# Patient Record
Sex: Female | Born: 1980 | Race: White | Hispanic: No | Marital: Single | Smoking: Former smoker
Health system: Southern US, Community
[De-identification: ages and names within clinical notes are randomized; demographics above are authoritative.]

## PROBLEM LIST (undated history)

## (undated) DIAGNOSIS — G8929 Other chronic pain: Secondary | ICD-10-CM

## (undated) DIAGNOSIS — M62830 Muscle spasm of back: Secondary | ICD-10-CM

## (undated) DIAGNOSIS — M545 Low back pain, unspecified: Secondary | ICD-10-CM

## (undated) DIAGNOSIS — R0602 Shortness of breath: Secondary | ICD-10-CM

## (undated) DIAGNOSIS — K219 Gastro-esophageal reflux disease without esophagitis: Secondary | ICD-10-CM

## (undated) DIAGNOSIS — M549 Dorsalgia, unspecified: Secondary | ICD-10-CM

## (undated) DIAGNOSIS — F329 Major depressive disorder, single episode, unspecified: Secondary | ICD-10-CM

## (undated) DIAGNOSIS — F32A Depression, unspecified: Secondary | ICD-10-CM

## (undated) DIAGNOSIS — O99210 Obesity complicating pregnancy, unspecified trimester: Secondary | ICD-10-CM

## (undated) DIAGNOSIS — F419 Anxiety disorder, unspecified: Secondary | ICD-10-CM

## (undated) DIAGNOSIS — O1413 Severe pre-eclampsia, third trimester: Secondary | ICD-10-CM

## (undated) DIAGNOSIS — O30033 Twin pregnancy, monochorionic/diamniotic, third trimester: Secondary | ICD-10-CM

## (undated) DIAGNOSIS — E669 Obesity, unspecified: Secondary | ICD-10-CM

## (undated) HISTORY — DX: Gastro-esophageal reflux disease without esophagitis: K21.9

## (undated) HISTORY — DX: Twin pregnancy, monochorionic/diamniotic, third trimester: O30.033

## (undated) HISTORY — PX: NO PAST SURGERIES: SHX2092

## (undated) HISTORY — DX: Major depressive disorder, single episode, unspecified: F32.9

## (undated) HISTORY — DX: Low back pain, unspecified: M54.50

## (undated) HISTORY — DX: Anxiety disorder, unspecified: F41.9

## (undated) HISTORY — DX: Depression, unspecified: F32.A

## (undated) HISTORY — DX: Dorsalgia, unspecified: M54.9

## (undated) HISTORY — DX: Muscle spasm of back: M62.830

## (undated) HISTORY — DX: Morbid (severe) obesity due to excess calories: E66.01

## (undated) HISTORY — DX: Obesity, unspecified: E66.9

## (undated) HISTORY — DX: Morbid (severe) obesity due to excess calories: O99.210

## (undated) HISTORY — DX: Severe pre-eclampsia, third trimester: O14.13

## (undated) HISTORY — DX: Other chronic pain: G89.29

## (undated) HISTORY — DX: Shortness of breath: R06.02

---

## 2010-11-09 ENCOUNTER — Emergency Department: Payer: Self-pay | Admitting: Emergency Medicine

## 2010-12-02 ENCOUNTER — Ambulatory Visit: Payer: Self-pay | Admitting: Nurse Practitioner

## 2011-01-02 ENCOUNTER — Other Ambulatory Visit: Payer: Self-pay | Admitting: Gastroenterology

## 2012-01-17 IMAGING — CT CT ABD-PELV W/ CM
1 of 2 series · 14 of 32 positions shown, 18 images · non-contrast
Comparison: none

REASON FOR EXAM: (1) RUQ pain off and on 2 weeks; (2) LLQ pain with
diarrhea for over one week
COMMENTS:   LMP: Negative Beta HCG

[Series 2: 3mm soft tissue · axial · 0.89mm/px · z∈[-552,-98]mm · 14 of 172 slices shown, 18 images]
[im 14/172  soft-tissue]
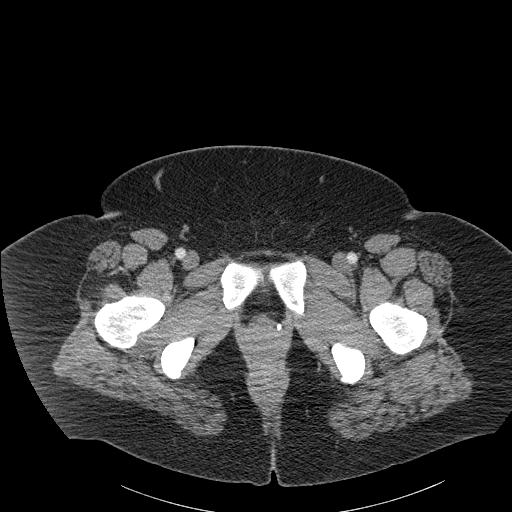
[im 14/172  bone]
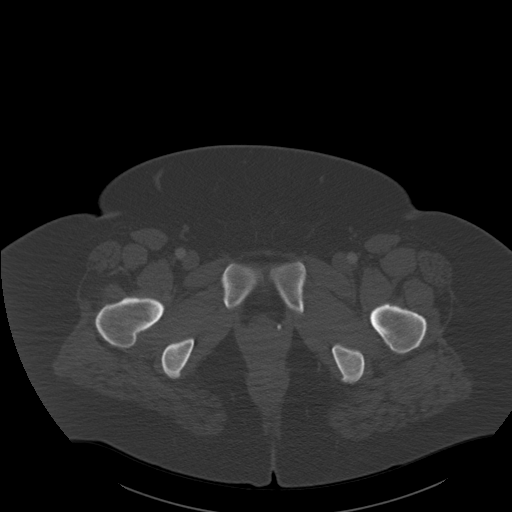
[im 28/172  soft-tissue]
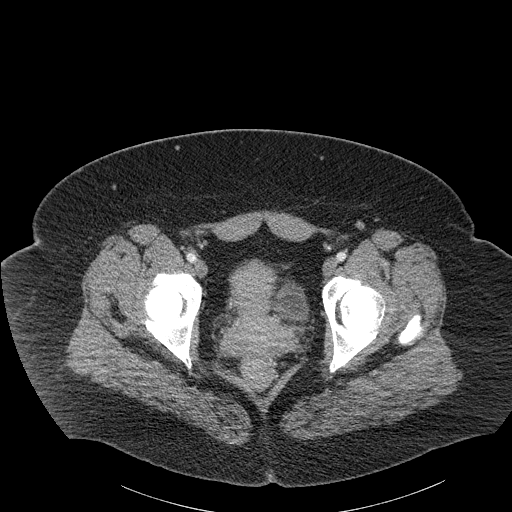
[im 42/172  soft-tissue]
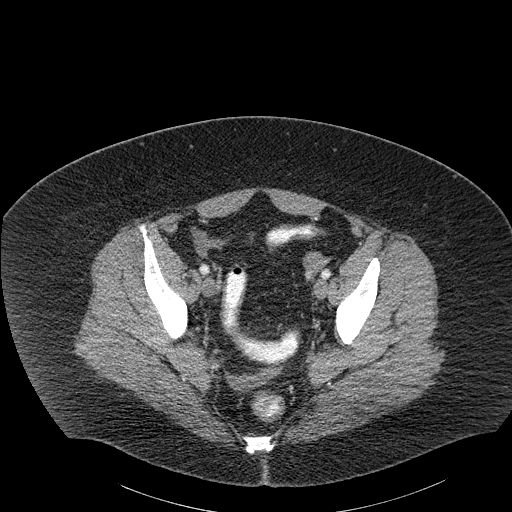
[im 55/172  soft-tissue]
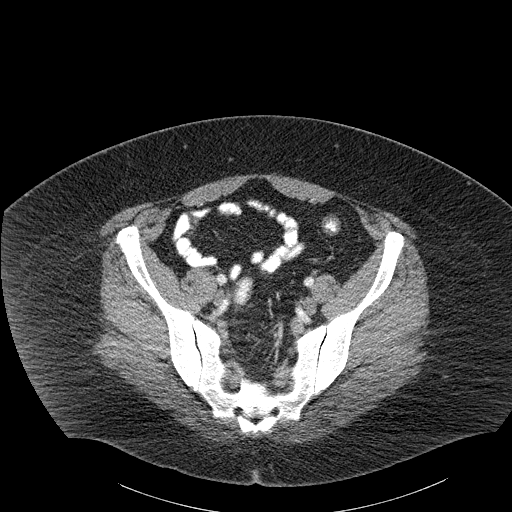
[im 69/172  soft-tissue]
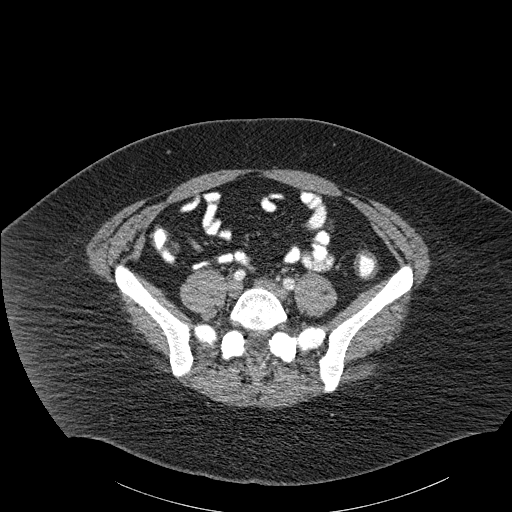
[im 83/172  soft-tissue]
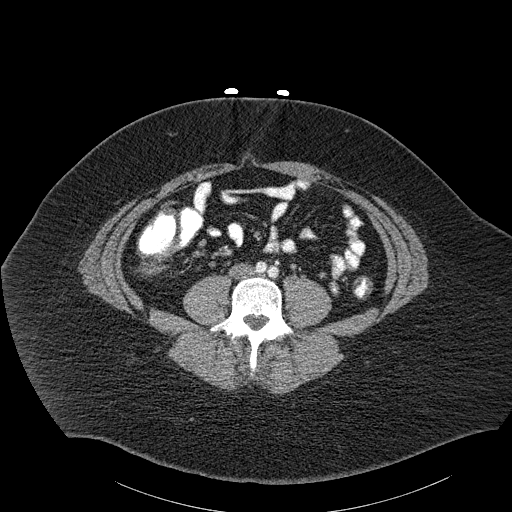
[im 96/172  soft-tissue]
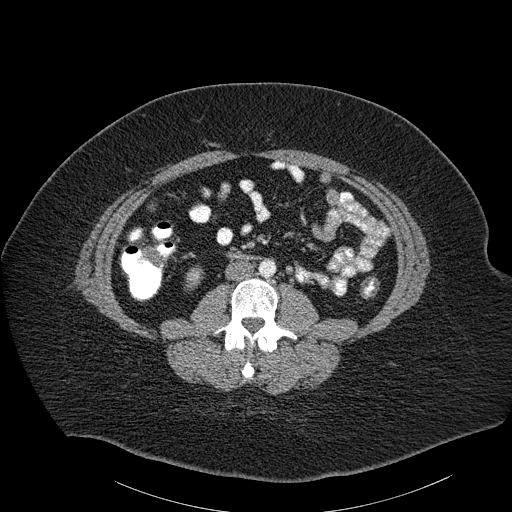
[im 110/172  soft-tissue]
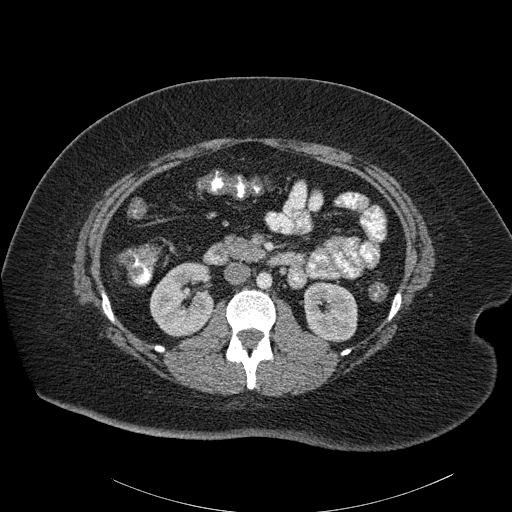
[im 124/172  soft-tissue]
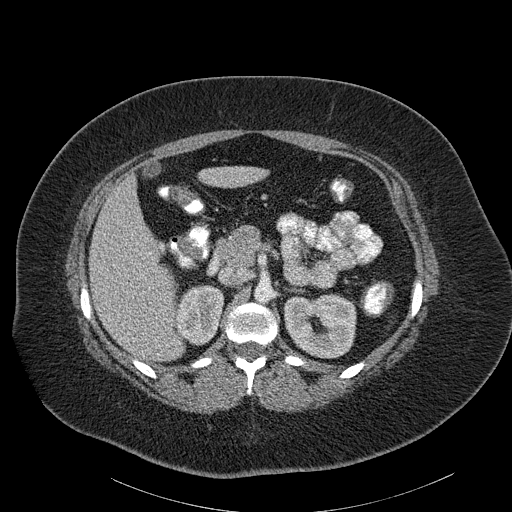
[im 124/172  bone]
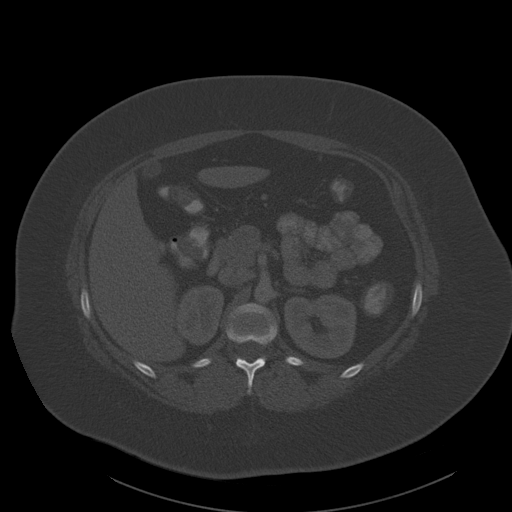
[im 137/172  soft-tissue]
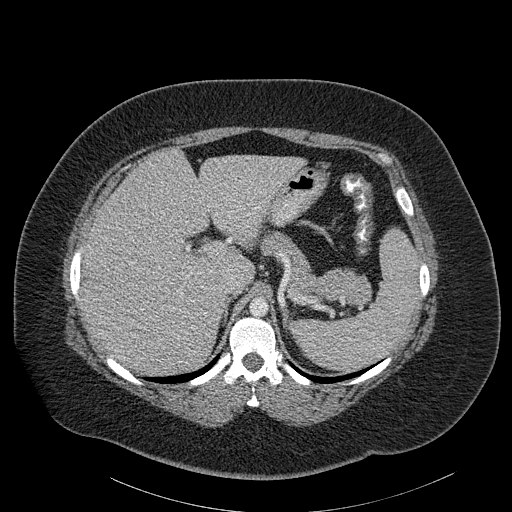
[im 144/172  lung]
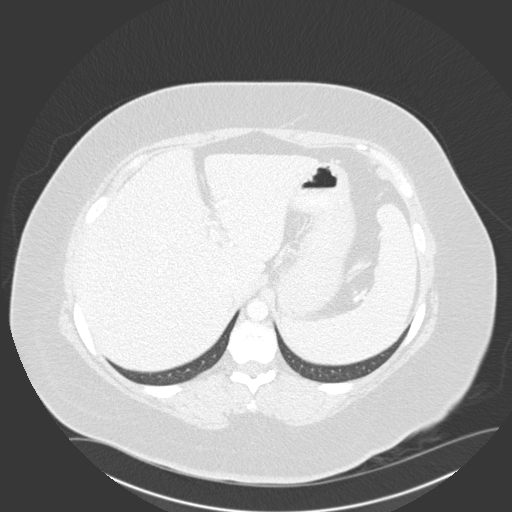
[im 151/172  soft-tissue]
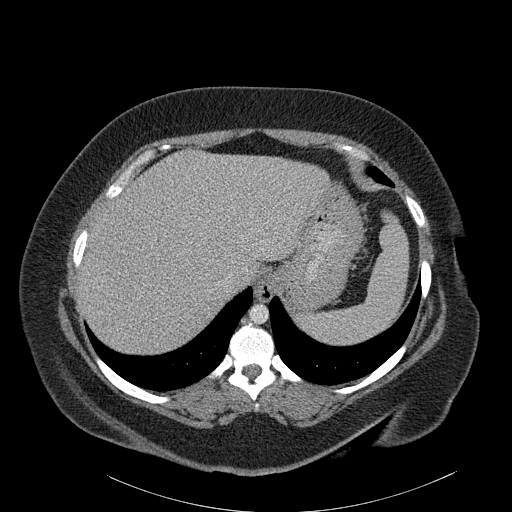
[im 151/172  lung]
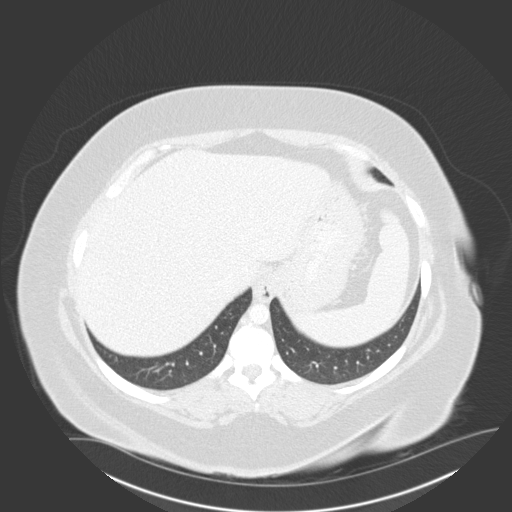
[im 158/172  lung]
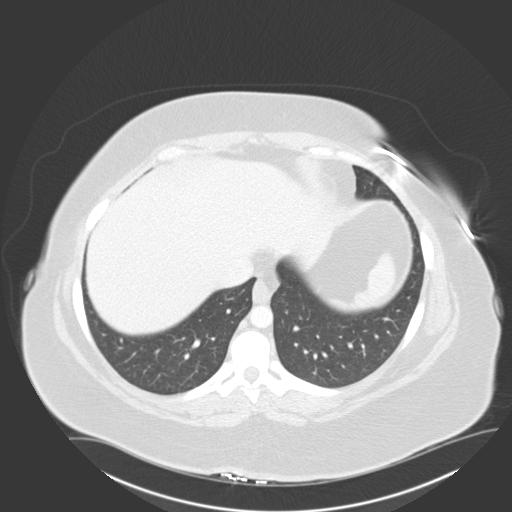
[im 165/172  soft-tissue]
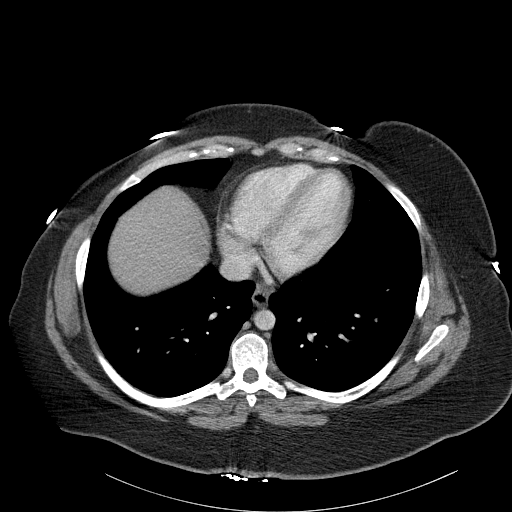
[im 165/172  lung]
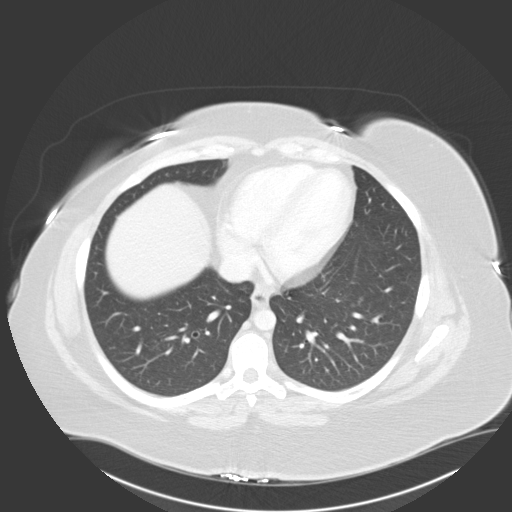

[14 of 32 positions shown; findings below may reference images not displayed]

PROCEDURE:     CT  - CT ABDOMEN / PELVIS  W  - November 09, 2010  [DATE]

RESULT:     Axial CT scanning was performed through the abdomen at 3 mm
intervals and slice thicknesses following intravenous administration of 100
cc of [id number]-[id number]. The patient also received oral contrast material. Review
of multiplanar reconstructed images was performed separately on the [employer]
Server monitor.

The orally administered contrast has traversed the small bowel and reached
the colon. The small bowel exhibits no evidence of ileus or obstruction. The
colon exhibits no evidence of obstruction. Its distention is fairly minimal
and there is subjective wall thickening of the transverse colon and
descending colon. The rectosigmoid colon is relatively featureless. There is
a small amount of free fluid in the prerectal space. There is no evidence of
an inguinal nor of significant umbilical hernia. There is a structure
demonstrated consistent with a normal calibered appendix. There are a few
mesenteric lymph nodes present in the right lower quadrant of the abdomen
which are normal in size. I see no inflammatory changes in the fat
surrounding the colon.

The liver exhibits mildly decreased density consistent with fatty
infiltration. The gallbladder is adequately distended with no evidence of
calcified stones. The pancreas, spleen, partially distended stomach, adrenal
glands, and kidneys are normal in appearance. The periaortic and pericaval
regions also appear normal. Within the pelvis the uterus and adnexal
structures and urinary bladder are within the limits of normal. The lumbar
vertebral bodies are preserved in height. The lung bases are clear.
IMPRESSION: 1. I do not see evidence of bowel obstruction or ileus. There is mild
prominence of the wall of the transverse and descending portions of the
colon. The rectosigmoid colon demonstrates no more than minimal wall
thickening but it is relatively featureless in appearance. I see no
pericolonic inflammatory change. A normal appearing appendix is demonstrated.
2. The uterus and adnexal structures exhibit no acute abnormality. There is
a small amount of free fluid in the pelvis.
3. There are a few normal sized mesenteric lymph nodes in the right lower
quadrant of the abdomen.
4. I see no acute hepatobiliary abnormality nor acute urinary tract
abnormality.

## 2013-12-22 ENCOUNTER — Ambulatory Visit: Payer: Self-pay | Admitting: Family Medicine

## 2013-12-22 LAB — RAPID STREP-A WITH REFLX: Micro Text Report: NEGATIVE

## 2013-12-24 LAB — BETA STREP CULTURE(ARMC)

## 2014-08-28 ENCOUNTER — Encounter: Payer: Self-pay | Admitting: Physician Assistant

## 2014-09-04 ENCOUNTER — Encounter: Payer: Self-pay | Admitting: Physician Assistant

## 2014-10-05 ENCOUNTER — Encounter: Payer: Self-pay | Admitting: Physician Assistant

## 2014-11-05 ENCOUNTER — Encounter: Payer: Self-pay | Admitting: Physician Assistant

## 2014-12-22 ENCOUNTER — Ambulatory Visit: Payer: Self-pay | Admitting: Internal Medicine

## 2015-01-15 ENCOUNTER — Encounter: Payer: Self-pay | Admitting: *Deleted

## 2015-03-12 DIAGNOSIS — M47817 Spondylosis without myelopathy or radiculopathy, lumbosacral region: Secondary | ICD-10-CM | POA: Insufficient documentation

## 2015-03-12 DIAGNOSIS — M47816 Spondylosis without myelopathy or radiculopathy, lumbar region: Secondary | ICD-10-CM | POA: Insufficient documentation

## 2015-03-12 DIAGNOSIS — M62830 Muscle spasm of back: Secondary | ICD-10-CM | POA: Insufficient documentation

## 2015-11-29 DIAGNOSIS — Z720 Tobacco use: Secondary | ICD-10-CM | POA: Diagnosis not present

## 2015-11-29 DIAGNOSIS — Z0000 Encounter for general adult medical examination without abnormal findings: Secondary | ICD-10-CM | POA: Diagnosis not present

## 2015-11-29 DIAGNOSIS — E782 Mixed hyperlipidemia: Secondary | ICD-10-CM | POA: Diagnosis not present

## 2015-11-29 DIAGNOSIS — R918 Other nonspecific abnormal finding of lung field: Secondary | ICD-10-CM | POA: Diagnosis not present

## 2015-11-29 DIAGNOSIS — Z79899 Other long term (current) drug therapy: Secondary | ICD-10-CM | POA: Diagnosis not present

## 2015-12-03 DIAGNOSIS — E782 Mixed hyperlipidemia: Secondary | ICD-10-CM | POA: Diagnosis not present

## 2015-12-03 DIAGNOSIS — Z79899 Other long term (current) drug therapy: Secondary | ICD-10-CM | POA: Diagnosis not present

## 2017-05-07 DIAGNOSIS — G8929 Other chronic pain: Secondary | ICD-10-CM | POA: Insufficient documentation

## 2017-05-07 DIAGNOSIS — M545 Low back pain, unspecified: Secondary | ICD-10-CM | POA: Insufficient documentation

## 2017-06-18 ENCOUNTER — Encounter: Payer: Self-pay | Admitting: Maternal Newborn

## 2017-06-28 ENCOUNTER — Ambulatory Visit (INDEPENDENT_AMBULATORY_CARE_PROVIDER_SITE_OTHER): Payer: BC Managed Care – PPO | Admitting: Maternal Newborn

## 2017-06-28 ENCOUNTER — Encounter: Payer: Self-pay | Admitting: Maternal Newborn

## 2017-06-28 VITALS — BP 122/82 | HR 83 | Ht 69.0 in | Wt 293.0 lb

## 2017-06-28 DIAGNOSIS — O0990 Supervision of high risk pregnancy, unspecified, unspecified trimester: Secondary | ICD-10-CM | POA: Insufficient documentation

## 2017-06-28 DIAGNOSIS — Z23 Encounter for immunization: Secondary | ICD-10-CM | POA: Diagnosis not present

## 2017-06-28 DIAGNOSIS — Z3400 Encounter for supervision of normal first pregnancy, unspecified trimester: Secondary | ICD-10-CM

## 2017-06-28 DIAGNOSIS — Z3401 Encounter for supervision of normal first pregnancy, first trimester: Secondary | ICD-10-CM | POA: Diagnosis not present

## 2017-06-28 DIAGNOSIS — Z6841 Body Mass Index (BMI) 40.0 and over, adult: Secondary | ICD-10-CM | POA: Diagnosis not present

## 2017-06-28 DIAGNOSIS — N926 Irregular menstruation, unspecified: Secondary | ICD-10-CM

## 2017-06-28 LAB — POCT URINE PREGNANCY: Preg Test, Ur: POSITIVE — AB

## 2017-06-28 NOTE — Patient Instructions (Signed)
First Trimester of Pregnancy The first trimester of pregnancy is from week 1 until the end of week 13 (months 1 through 3). A week after a sperm fertilizes an egg, the egg will implant on the wall of the uterus. This embryo will begin to develop into a baby. Genes from you and your partner will form the baby. The female genes will determine whether the baby will be a boy or a girl. At 6-8 weeks, the eyes and face will be formed, and the heartbeat can be seen on ultrasound. At the end of 12 weeks, all the baby's organs will be formed. Now that you are pregnant, you will want to do everything you can to have a healthy baby. Two of the most important things are to get good prenatal care and to follow your health care provider's instructions. Prenatal care is all the medical care you receive before the baby's birth. This care will help prevent, find, and treat any problems during the pregnancy and childbirth. Body changes during your first trimester Your body goes through many changes during pregnancy. The changes vary from woman to woman.  You may gain or lose a couple of pounds at first.  You may feel sick to your stomach (nauseous) and you may throw up (vomit). If the vomiting is uncontrollable, call your health care provider.  You may tire easily.  You may develop headaches that can be relieved by medicines. All medicines should be approved by your health care provider.  You may urinate more often. Painful urination may mean you have a bladder infection.  You may develop heartburn as a result of your pregnancy.  You may develop constipation because certain hormones are causing the muscles that push stool through your intestines to slow down.  You may develop hemorrhoids or swollen veins (varicose veins).  Your breasts may begin to grow larger and become tender. Your nipples may stick out more, and the tissue that surrounds them (areola) may become darker.  Your gums may bleed and may be  sensitive to brushing and flossing.  Dark spots or blotches (chloasma, mask of pregnancy) may develop on your face. This will likely fade after the baby is born.  Your menstrual periods will stop.  You may have a loss of appetite.  You may develop cravings for certain kinds of food.  You may have changes in your emotions from day to day, such as being excited to be pregnant or being concerned that something may go wrong with the pregnancy and baby.  You may have more vivid and strange dreams.  You may have changes in your hair. These can include thickening of your hair, rapid growth, and changes in texture. Some women also have hair loss during or after pregnancy, or hair that feels dry or thin. Your hair will most likely return to normal after your baby is born.  What to expect at prenatal visits During a routine prenatal visit:  You will be weighed to make sure you and the baby are growing normally.  Your blood pressure will be taken.  Your abdomen will be measured to track your baby's growth.  The fetal heartbeat will be listened to between weeks 10 and 14 of your pregnancy.  Test results from any previous visits will be discussed.  Your health care provider may ask you:  How you are feeling.  If you are feeling the baby move.  If you have had any abnormal symptoms, such as leaking fluid, bleeding, severe headaches,   or abdominal cramping.  If you are using any tobacco products, including cigarettes, chewing tobacco, and electronic cigarettes.  If you have any questions.  Other tests that may be performed during your first trimester include:  Blood tests to find your blood type and to check for the presence of any previous infections. The tests will also be used to check for low iron levels (anemia) and protein on red blood cells (Rh antibodies). Depending on your risk factors, or if you previously had diabetes during pregnancy, you may have tests to check for high blood  sugar that affects pregnant women (gestational diabetes).  Urine tests to check for infections, diabetes, or protein in the urine.  An ultrasound to confirm the proper growth and development of the baby.  Fetal screens for spinal cord problems (spina bifida) and Down syndrome.  HIV (human immunodeficiency virus) testing. Routine prenatal testing includes screening for HIV, unless you choose not to have this test.  You may need other tests to make sure you and the baby are doing well.  Follow these instructions at home: Medicines  Follow your health care provider's instructions regarding medicine use. Specific medicines may be either safe or unsafe to take during pregnancy.  Take a prenatal vitamin that contains at least 600 micrograms (mcg) of folic acid.  If you develop constipation, try taking a stool softener if your health care provider approves. Eating and drinking  Eat a balanced diet that includes fresh fruits and vegetables, whole grains, good sources of protein such as meat, eggs, or tofu, and low-fat dairy. Your health care provider will help you determine the amount of weight gain that is right for you.  Avoid raw meat and uncooked cheese. These carry germs that can cause birth defects in the baby.  Eating four or five small meals rather than three large meals a day may help relieve nausea and vomiting. If you start to feel nauseous, eating a few soda crackers can be helpful. Drinking liquids between meals, instead of during meals, also seems to help ease nausea and vomiting.  Limit foods that are high in fat and processed sugars, such as fried and sweet foods.  To prevent constipation: ? Eat foods that are high in fiber, such as fresh fruits and vegetables, whole grains, and beans. ? Drink enough fluid to keep your urine clear or pale yellow. Activity  Exercise only as directed by your health care provider. Most women can continue their usual exercise routine during  pregnancy. Try to exercise for 30 minutes at least 5 days a week. Exercising will help you: ? Control your weight. ? Stay in shape. ? Be prepared for labor and delivery.  Experiencing pain or cramping in the lower abdomen or lower back is a good sign that you should stop exercising. Check with your health care provider before continuing with normal exercises.  Try to avoid standing for long periods of time. Move your legs often if you must stand in one place for a long time.  Avoid heavy lifting.  Wear low-heeled shoes and practice good posture.  You may continue to have sex unless your health care provider tells you not to. Relieving pain and discomfort  Wear a good support bra to relieve breast tenderness.  Take warm sitz baths to soothe any pain or discomfort caused by hemorrhoids. Use hemorrhoid cream if your health care provider approves.  Rest with your legs elevated if you have leg cramps or low back pain.  If you develop   varicose veins in your legs, wear support hose. Elevate your feet for 15 minutes, 3-4 times a day. Limit salt in your diet. Prenatal care  Schedule your prenatal visits by the twelfth week of pregnancy. They are usually scheduled monthly at first, then more often in the last 2 months before delivery.  Write down your questions. Take them to your prenatal visits.  Keep all your prenatal visits as told by your health care provider. This is important. Safety  Wear your seat belt at all times when driving.  Make a list of emergency phone numbers, including numbers for family, friends, the hospital, and police and fire departments. General instructions  Ask your health care provider for a referral to a local prenatal education class. Begin classes no later than the beginning of month 6 of your pregnancy.  Ask for help if you have counseling or nutritional needs during pregnancy. Your health care provider can offer advice or refer you to specialists for help  with various needs.  Do not use hot tubs, steam rooms, or saunas.  Do not douche or use tampons or scented sanitary pads.  Do not cross your legs for long periods of time.  Avoid cat litter boxes and soil used by cats. These carry germs that can cause birth defects in the baby and possibly loss of the fetus by miscarriage or stillbirth.  Avoid all smoking, herbs, alcohol, and medicines not prescribed by your health care provider. Chemicals in these products affect the formation and growth of the baby.  Do not use any products that contain nicotine or tobacco, such as cigarettes and e-cigarettes. If you need help quitting, ask your health care provider. You may receive counseling support and other resources to help you quit.  Schedule a dentist appointment. At home, brush your teeth with a soft toothbrush and be gentle when you floss. Contact a health care provider if:  You have dizziness.  You have mild pelvic cramps, pelvic pressure, or nagging pain in the abdominal area.  You have persistent nausea, vomiting, or diarrhea.  You have a bad smelling vaginal discharge.  You have pain when you urinate.  You notice increased swelling in your face, hands, legs, or ankles.  You are exposed to fifth disease or chickenpox.  You are exposed to German measles (rubella) and have never had it. Get help right away if:  You have a fever.  You are leaking fluid from your vagina.  You have spotting or bleeding from your vagina.  You have severe abdominal cramping or pain.  You have rapid weight gain or loss.  You vomit blood or material that looks like coffee grounds.  You develop a severe headache.  You have shortness of breath.  You have any kind of trauma, such as from a fall or a car accident. Summary  The first trimester of pregnancy is from week 1 until the end of week 13 (months 1 through 3).  Your body goes through many changes during pregnancy. The changes vary from  woman to woman.  You will have routine prenatal visits. During those visits, your health care provider will examine you, discuss any test results you may have, and talk with you about how you are feeling. This information is not intended to replace advice given to you by your health care provider. Make sure you discuss any questions you have with your health care provider. Document Released: 09/15/2001 Document Revised: 09/02/2016 Document Reviewed: 09/02/2016 Elsevier Interactive Patient Education  2017 Elsevier   Inc.  

## 2017-06-28 NOTE — Progress Notes (Signed)
06/28/2017   Chief Complaint: Positive home pregnancy test, desires prenatal care.  Transfer of Care Patient: no  History of Present Illness: Rachel Cooley is a 36 y.o. G1P0 [redacted]w[redacted]d based on Patient's last menstrual period was 05/09/2017 (exact date). with an Estimated Date of Delivery: 02/13/18, with the above CC.   Her periods were: regular periods every 28 days. She was using oral contraceptives (estrogen/progesterone) when she conceived.  She has Positive signs or symptoms of nausea/vomiting of pregnancy and breast tenderness. She has Negative signs or symptoms of miscarriage or preterm labor She identifies Negative Zika risk factors for her and her partner On any different medications around the time she conceived/early pregnancy: No  History of varicella: Yes   ROS: A 12-point review of systems was performed and negative, except as stated in the above HPI.  OBGYN History: As per HPI. OB History  Gravida Para Term Preterm AB Living  1            SAB TAB Ectopic Multiple Live Births               # Outcome Date GA Lbr Len/2nd Weight Sex Delivery Anes PTL Lv  1 Current               Any issues with any prior pregnancies: not applicable Any prior children are healthy, doing well, without any problems or issues: not applicable History of pap smears: Yes. Last pap smear 04/02/2016; NILM and HPV Negative. History of STIs: No   Past Medical History: Past Medical History:  Diagnosis Date  . Anxiety   . Depression   . GERD (gastroesophageal reflux disease)   . Obesity     Past Surgical History: Past Surgical History:  Procedure Laterality Date  . NO PAST SURGERIES      Family History:  Family History  Problem Relation Age of Onset  . Hypertension Mother   . Hyperlipidemia Mother   . Hypertension Father   . Brain cancer Maternal Grandmother   . Lung cancer Maternal Grandfather   . Brain cancer Paternal Grandfather   . Diabetes Neg Hx   . Stroke Neg Hx    She  denies any female cancers, bleeding or blood clotting disorders.  She denies any history of intellectual disability, birth defects or genetic disorders in her or the FOB's history  Social History:  Social History   Social History  . Marital status: Single    Spouse name: N/A  . Number of children: N/A  . Years of education: N/A   Occupational History  . Not on file.   Social History Main Topics  . Smoking status: Former Smoker    Types: Cigarettes  . Smokeless tobacco: Never Used     Comment: stopped with +UPT  . Alcohol use No  . Drug use: No  . Sexual activity: Yes    Partners: Male    Birth control/ protection: Pill   Other Topics Concern  . Not on file   Social History Narrative  . No narrative on file   Any cats in the household: no   Allergy: No Known Allergies  Current Outpatient Medications:  Current Outpatient Prescriptions:  .  buPROPion (WELLBUTRIN XL) 150 MG 24 hr tablet, Take by mouth., Disp: , Rfl:  .  cetirizine (ZYRTEC) 10 MG tablet, Take by mouth., Disp: , Rfl:  .  Docosahexaenoic Acid (PRENATAL DHA) 200 MG CAPS, Take by mouth., Disp: , Rfl:  .  Multiple Vitamin (MULTIVITAMIN) tablet, Take  1 tablet by mouth daily., Disp: , Rfl:  .  omeprazole (PRILOSEC) 40 MG capsule, Take by mouth., Disp: , Rfl:  .  polycarbophil (FIBERCON) 625 MG tablet, Take 625 mg by mouth 2 (two) times daily., Disp: , Rfl:  .  Prenatal MV & Min w/FA-DHA (ALIVE PRENATAL PO), Take by mouth., Disp: , Rfl:  .  Probiotic Product (PROBIOTIC-10 PO), Take by mouth., Disp: , Rfl:  .  venlafaxine XR (EFFEXOR-XR) 75 MG 24 hr capsule, Take by mouth., Disp: , Rfl:    Physical Exam:   BP 122/82   Pulse 83   Ht  (1.753 m)   Wt 293 lb (132.9 kg)   LMP 05/09/2017 (Exact Date)   BMI 43.27 kg/m  Body mass index is 43.27 kg/m. Constitutional: Well nourished, well developed female in no acute distress.  Neck:  Supple, normal appearance, and no thyromegaly  Cardiovascular: S1, S2  normal, no murmur, rub or gallop, regular rate and rhythm Respiratory:  Clear to auscultation bilateral. Normal respiratory effort Abdomen: positive bowel sounds and no masses, hernias; diffusely non tender to palpation, non distended Breasts: breasts appear normal, no suspicious masses, no skin or nipple changes or axillary nodes. Neuro/Psych:  Normal mood and affect.  Skin:  Warm and dry.  Lymphatic:  No inguinal lymphadenopathy.   Pelvic exam: is limited by body habitus External genitalia, Bartholin's glands, Urethra, Skene's glands: within normal limits Vagina: within normal limits and with no blood in the vault  Cervix: normal appearing cervix without discharge or lesions, closed/long/high Uterus:  Difficult to palpate Adnexa:  no mass, fullness, tenderness  Assessment: Rachel Cooley is a 36 y.o. G1P0 [redacted]w[redacted]d based on Patient's last menstrual period was 05/09/2017 (exact date). with an Estimated Date of Delivery: 02/13/18, presenting for prenatal care.  Plan:  1) Avoid alcoholic beverages. 2) Patient encouraged not to smoke. Recent cessation, denies need for nicotine patch. 3) Discontinue the use of all non-medicinal drugs and chemicals.  4) Take prenatal vitamins daily.  5) Seatbelt use advised. 6) Nutrition, food safety (fish, cheese advisories, and high nitrite foods) and exercise discussed. 7) Hospital and practice style delivering at [facility] discussed.  8) Patient is asked about travel to areas at risk for the Zika virus, and counseled to avoid travel and exposure to mosquitoes or sexual partners who may have themselves been exposed to the virus.   9) Childbirth classes at [facility] advised. 10) Genetic Screening, such as with 1st Trimester Screening, cell free fetal DNA, AFP testing, and Ultrasound, as well as with amniocentesis and CVS as appropriate, is discussed with patient. She plans to have genetic testing this pregnancy.  Patient counseled on current information available about  fetal/newborn effects of venlafaxine and buproprion vs. switching to Zoloft. At present, she desires to continue her current medications.  Problem list reviewed and updated.  Marcelyn Bruins, CNM [facility], [facility] 06/28/2017  1:15 PM

## 2017-06-30 LAB — URINE DRUG PANEL 7
Amphetamines, Urine: NEGATIVE ng/mL
Barbiturate Quant, Ur: NEGATIVE ng/mL
Benzodiazepine Quant, Ur: NEGATIVE ng/mL
CANNABINOID QUANT UR: NEGATIVE ng/mL
Cocaine (Metab.): NEGATIVE ng/mL
OPIATE QUANT UR: NEGATIVE ng/mL
PCP QUANT UR: NEGATIVE ng/mL

## 2017-06-30 LAB — GC/CHLAMYDIA PROBE AMP
CHLAMYDIA, DNA PROBE: NEGATIVE
NEISSERIA GONORRHOEAE BY PCR: NEGATIVE

## 2017-06-30 LAB — URINE CULTURE: Organism ID, Bacteria: NO GROWTH

## 2017-07-05 ENCOUNTER — Other Ambulatory Visit: Payer: BC Managed Care – PPO

## 2017-07-05 ENCOUNTER — Ambulatory Visit (INDEPENDENT_AMBULATORY_CARE_PROVIDER_SITE_OTHER): Payer: BC Managed Care – PPO | Admitting: Obstetrics and Gynecology

## 2017-07-05 ENCOUNTER — Ambulatory Visit (INDEPENDENT_AMBULATORY_CARE_PROVIDER_SITE_OTHER): Payer: BC Managed Care – PPO

## 2017-07-05 ENCOUNTER — Other Ambulatory Visit: Payer: Self-pay | Admitting: Maternal Newborn

## 2017-07-05 VITALS — BP 128/84 | Wt 296.0 lb

## 2017-07-05 DIAGNOSIS — Z3A09 9 weeks gestation of pregnancy: Secondary | ICD-10-CM | POA: Diagnosis not present

## 2017-07-05 DIAGNOSIS — Z6841 Body Mass Index (BMI) 40.0 and over, adult: Secondary | ICD-10-CM | POA: Diagnosis not present

## 2017-07-05 DIAGNOSIS — O30041 Twin pregnancy, dichorionic/diamniotic, first trimester: Secondary | ICD-10-CM

## 2017-07-05 DIAGNOSIS — O09521 Supervision of elderly multigravida, first trimester: Secondary | ICD-10-CM | POA: Diagnosis not present

## 2017-07-05 DIAGNOSIS — O09529 Supervision of elderly multigravida, unspecified trimester: Secondary | ICD-10-CM | POA: Insufficient documentation

## 2017-07-05 DIAGNOSIS — F331 Major depressive disorder, recurrent, moderate: Secondary | ICD-10-CM | POA: Diagnosis not present

## 2017-07-05 DIAGNOSIS — F329 Major depressive disorder, single episode, unspecified: Secondary | ICD-10-CM | POA: Insufficient documentation

## 2017-07-05 DIAGNOSIS — O30049 Twin pregnancy, dichorionic/diamniotic, unspecified trimester: Secondary | ICD-10-CM | POA: Insufficient documentation

## 2017-07-05 DIAGNOSIS — Z3400 Encounter for supervision of normal first pregnancy, unspecified trimester: Secondary | ICD-10-CM

## 2017-07-05 DIAGNOSIS — F419 Anxiety disorder, unspecified: Secondary | ICD-10-CM | POA: Diagnosis not present

## 2017-07-05 DIAGNOSIS — O0990 Supervision of high risk pregnancy, unspecified, unspecified trimester: Secondary | ICD-10-CM

## 2017-07-05 DIAGNOSIS — O99211 Obesity complicating pregnancy, first trimester: Secondary | ICD-10-CM | POA: Diagnosis not present

## 2017-07-05 DIAGNOSIS — F32A Depression, unspecified: Secondary | ICD-10-CM | POA: Insufficient documentation

## 2017-07-05 DIAGNOSIS — O99210 Obesity complicating pregnancy, unspecified trimester: Secondary | ICD-10-CM | POA: Insufficient documentation

## 2017-07-05 NOTE — Progress Notes (Signed)
Routine Prenatal Care Visit  Subjective  Rachel Cooley is a 36 y.o. G1P0 at [redacted]w[redacted]d being seen today for ongoing prenatal care.  She is currently monitored for the following issues for this high-risk pregnancy and has BMI 40.0-44.9, adult (HCC); Supervision of high risk pregnancy, antepartum; Obesity affecting pregnancy; Dichorionic diamniotic twin gestation; Anxiety; Depression; and Advanced maternal age in multigravida on her problem list.  ----------------------------------------------------------------------------------- Patient reports no complaints.    . Vag. Bleeding: None.   . Denies leaking of fluid.  U/s shows di-di twins!  ----------------------------------------------------------------------------------- The following portions of the patient's history were reviewed and updated as appropriate: allergies, current medications, past family history, past medical history, past social history, past surgical history and problem list. Problem list updated.   Objective  Blood pressure 128/84, weight 296 lb (134.3 kg), last menstrual period 05/09/2017. Pregravid weight 283 lb (128.4 kg) Total Weight Gain 13 lb (5.897 kg) Urinalysis: Urine Protein: Negative Urine Glucose: Negative  Fetal Status: Fetal Heart Rate (bpm): Present x 2         General:  Alert, oriented and cooperative. Patient is in no acute distress.  Skin: Skin is warm and dry. No rash noted.   Cardiovascular: Normal heart rate noted  Respiratory: Normal respiratory effort, no problems with respiration noted  Abdomen: Soft, gravid, appropriate for gestational age. Pain/Pressure: Absent     Pelvic:  Cervical exam deferred        Extremities: Normal range of motion.     Mental Status: Normal mood and affect. Normal behavior. Normal judgment and thought content.   Assessment   36 y.o. G1P0 at [redacted]w[redacted]d by  02/07/2018, by Ultrasound presenting for routine prenatal visit  Plan   first pregnancy Problems (from 06/28/17 to  present)    Problem Noted Resolved   Obesity affecting pregnancy 07/05/2017 by Conard Novak, MD No   Dichorionic diamniotic twin gestation 07/05/2017 by Conard Novak, MD No   Anxiety 07/05/2017 by Conard Novak, MD No   Depression 07/05/2017 by Conard Novak, MD No   Advanced maternal age in multigravida 07/05/2017 by Conard Novak, MD No   BMI 40.0-44.9, adult (HCC) 06/28/2017 by Oswaldo Conroy, CNM No   Overview Signed 06/28/2017  8:48 AM by Oswaldo Conroy, CNM    BMI >=40  early 1h gtt -   u/s for dating    nutritional goals  folic acid   bASA (>12 weeks)  consider nutrition consult  consider maternal EKG 1st trimester  Growth u/s 28 , 32 , 36 weeks   NST/AFI weekly 36+ weeks (36[] , 37[] , 38[] , 39[] , 40[] )  IOL by 41 weeks (scheduled, prn )      Supervision of high risk pregnancy, antepartum 06/28/2017 by Oswaldo Conroy, CNM No   Overview Signed 06/28/2017  1:22 PM by Oswaldo Conroy, CNM    [facility] Prenatal Labs  Dating  Blood type:     Genetic Screen 1 Screen:    AFP:     Quad:     NIPS: Antibody:   Anatomic [place]  Rubella:   Varicella:    GTT Early:               Third trimester:  RPR:     Rhogam  HBsAg:     TDaP vaccine  Flu Shot: HIV:     Baby Food                                GBS:   Contraception  Pap:  CBB     CS/VBAC    Support Person               Please refer to After Visit Summary for other counseling recommendations.   Return in about 3 weeks (around 07/26/2017) for schedule lab for NIPT with routine prenatal.  Thomasene Mohair, MD  07/05/2017 12:39 PM

## 2017-07-06 LAB — RPR+RH+ABO+RUB AB+AB SCR+CB...
ANTIBODY SCREEN: NEGATIVE
HIV SCREEN 4TH GENERATION: NONREACTIVE
Hematocrit: 35.5 % (ref 34.0–46.6)
Hemoglobin: 12 g/dL (ref 11.1–15.9)
Hepatitis B Surface Ag: NEGATIVE
MCH: 29.5 pg (ref 26.6–33.0)
MCHC: 33.8 g/dL (ref 31.5–35.7)
MCV: 87 fL (ref 79–97)
PLATELETS: 225 10*3/uL (ref 150–379)
RBC: 4.07 x10E6/uL (ref 3.77–5.28)
RDW: 14.4 % (ref 12.3–15.4)
RPR: NONREACTIVE
RUBELLA: 3.08 {index} (ref >0.99)
Rh Factor: POSITIVE
Varicella zoster IgG: 548 index (ref >165)
WBC: 10.2 10*3/uL (ref 3.4–10.8)

## 2017-07-06 LAB — GLUCOSE, 1 HOUR GESTATIONAL: Gestational Diabetes Screen: 73 mg/dL (ref 65–139)

## 2017-07-07 ENCOUNTER — Telehealth: Payer: Self-pay

## 2017-07-07 NOTE — Telephone Encounter (Signed)
Pt inquiring about results for labs drawn 07/05/17.

## 2017-07-07 NOTE — Telephone Encounter (Signed)
Please let pt know all labs normal, passed gtt. Thank you.

## 2017-07-08 NOTE — Telephone Encounter (Signed)
PT aware

## 2017-07-13 ENCOUNTER — Encounter: Payer: Self-pay | Admitting: Maternal Newborn

## 2017-07-26 ENCOUNTER — Other Ambulatory Visit: Payer: Self-pay | Admitting: Obstetrics and Gynecology

## 2017-07-26 ENCOUNTER — Ambulatory Visit (INDEPENDENT_AMBULATORY_CARE_PROVIDER_SITE_OTHER): Payer: BC Managed Care – PPO | Admitting: Obstetrics and Gynecology

## 2017-07-26 ENCOUNTER — Other Ambulatory Visit (INDEPENDENT_AMBULATORY_CARE_PROVIDER_SITE_OTHER): Payer: BC Managed Care – PPO

## 2017-07-26 VITALS — BP 140/80 | Wt 302.0 lb

## 2017-07-26 DIAGNOSIS — O169 Unspecified maternal hypertension, unspecified trimester: Secondary | ICD-10-CM | POA: Insufficient documentation

## 2017-07-26 DIAGNOSIS — Z1379 Encounter for other screening for genetic and chromosomal anomalies: Secondary | ICD-10-CM | POA: Insufficient documentation

## 2017-07-26 DIAGNOSIS — O30111 Triplet pregnancy with two or more monochorionic fetuses, first trimester: Secondary | ICD-10-CM

## 2017-07-26 DIAGNOSIS — O09521 Supervision of elderly multigravida, first trimester: Secondary | ICD-10-CM | POA: Diagnosis not present

## 2017-07-26 DIAGNOSIS — O30041 Twin pregnancy, dichorionic/diamniotic, first trimester: Secondary | ICD-10-CM

## 2017-07-26 DIAGNOSIS — O30119 Triplet pregnancy with two or more monochorionic fetuses, unspecified trimester: Secondary | ICD-10-CM

## 2017-07-26 DIAGNOSIS — Z3A12 12 weeks gestation of pregnancy: Secondary | ICD-10-CM | POA: Diagnosis not present

## 2017-07-26 DIAGNOSIS — Z6841 Body Mass Index (BMI) 40.0 and over, adult: Secondary | ICD-10-CM | POA: Diagnosis not present

## 2017-07-26 NOTE — Progress Notes (Signed)
Routine Prenatal Care Visit  Subjective  Rachel Cooley is a 36 y.o. G1P0 at 5258w0d being seen today for ongoing prenatal care.  She is currently monitored for the following issues for this high-risk pregnancy and has BMI 40.0-44.9, adult (HCC); Supervision of high risk pregnancy, antepartum; Obesity affecting pregnancy; Dichorionic diamniotic twin gestation; Anxiety; Depression; Advanced maternal age in multigravida; and Elevated blood pressure affecting pregnancy, antepartum on her problem list.  ----------------------------------------------------------------------------------- Patient reports no complaints.    .  .   . Denies leaking of fluid.  ----------------------------------------------------------------------------------- The following portions of the patient's history were reviewed and updated as appropriate: allergies, current medications, past family history, past medical history, past social history, past surgical history and problem list. Problem list updated.   Objective  Blood pressure 140/80, weight (!) 302 lb (137 kg), last menstrual period 05/09/2017. Pregravid weight 283 lb (128.4 kg) Total Weight Gain 19 lb (8.618 kg) Urinalysis:      Fetal Status:           General:  Alert, oriented and cooperative. Patient is in no acute distress.  Skin: Skin is warm and dry. No rash noted.   Cardiovascular: Normal heart rate noted  Respiratory: Normal respiratory effort, no problems with respiration noted  Abdomen: Soft, gravid, appropriate for gestational age.       Pelvic:  Cervical exam deferred        Extremities: Normal range of motion.     ental Status: Normal mood and affect. Normal behavior. Normal judgment and thought content.     Assessment   36 y.o. G1P0 at 7458w0d by  02/07/2018, by Ultrasound presenting for routine prenatal visit  Plan   first pregnancy Problems (from 06/28/17 to present)    Problem Noted Resolved   Obesity affecting pregnancy 07/05/2017  by Conard NovakJackson, Stephen D, MD No   Dichorionic diamniotic twin gestation 07/05/2017 by Conard NovakJackson, Stephen D, MD No   Anxiety 07/05/2017 by Conard NovakJackson, Stephen D, MD No   Depression 07/05/2017 by Conard NovakJackson, Stephen D, MD No   Advanced maternal age in multigravida 07/05/2017 by Conard NovakJackson, Stephen D, MD No   BMI 40.0-44.9, adult (HCC) 06/28/2017 by Oswaldo ConroySchmid, Jacelyn Y, CNM No   Overview Signed 06/28/2017  8:48 AM by Oswaldo ConroySchmid, Jacelyn Y, CNM    BMI >=40 [ ]  early 1h gtt -  [ ]  u/s for dating [ ]   [ ]  nutritional goals [ ]  folic acid 1mg  [ ]  bASA (>12 weeks) [ ]  consider nutrition consult [ ]  consider maternal EKG 1st trimester [ ]  Growth u/s 28 [ ] , 32 [ ] , 36 weeks [ ]  [ ]  NST/AFI weekly 36+ weeks (36[] , 37[] , 38[] , 39[] , 40[] ) [ ]  IOL by 41 weeks (scheduled, prn [] )      Supervision of high risk pregnancy, antepartum 06/28/2017 by Oswaldo ConroySchmid, Jacelyn Y, CNM No   Overview Addendum 07/07/2017  8:13 AM by Oswaldo ConroySchmid, Jacelyn Y, CNM    [facility]inic [facility] Prenatal Labs  Dating  Blood type:   O Pos  Genetic Screen 1 Screen:    AFP:     Quad:     NIPS: Antibody: Neg  Anatomic US  Rubella: Immune  Varicella:  Immune  GTT Early: 373            Third trimester:  RPR:   NR  Rhogam  HBsAg:   Neg  TDaP vaccine  Flu Shot: HIV:   NR  Baby Food                                GBS:   Contraception  Pap:  CBB     CS/VBAC    Support Person                  General obstetric precautions including but not limited to vaginal bleeding, contractions, leaking of fluid and fetal movement were reviewed in detail with the patient. Please refer to After Visit Summary for other counseling recommendations.   - Given early gestational age and habitus ultrasound was ordered to evaluate fetal hear tones. During ultrasound it became apparent that the patient actually had a viable triplet gestation.   Given triplets we discussed care at a tertiary medical center.  - Elevated BP today baseline P/C ratio, HgbA1C, and CMP  obtained  - Recommend ASA after 13 weeks given >35 at time of delivery, multiple gestation, and obesity  Return in about 4 weeks (around 08/23/2017) for ROB and ultrasound 4 weeks.

## 2017-07-26 NOTE — Patient Instructions (Signed)
Start low dose ASA at >[redacted] weeks gestation as per USPTF recommendation "Low-Dose Aspirin Use for the Prevention of Morbidity and Mortality From Preeclampsia: Preventive Medicine"  furthermore endorsed by ACOG, WHO, and NIH based on evidence level B for the prevention of preeclampsia  In women deemed high risk  (diabetes, renal disease, chronic hypertension, history of preeclampsia in prior gestation, autoimmune diseases, or multifetal gestations).  ACOG Committee Opinion 743 "Low-Dose Asprin Use During Pregnancy" June 25th 2018  High Risk (Start if 1 or more present) History of preeclampsia Multifetal Gestation Chronic HTN Type I or II DM Renal Disease Autoimmune Disease (SLE, Antiphospholipid antibody syndrome)  Moderate Risk (consider starting if more than one present) Nulliparity Obesity (BMI >30) Family history of Preeclampsia (Mother or sister) Socioeconomic characteristics (African American, low socieeconomic status) Age 35 years or older Personal history factors (low birthweight of SGA, previous adverse pregnancy outcome, more than 10 year pregnancy interval)  

## 2017-07-26 NOTE — Progress Notes (Signed)
ROB

## 2017-07-27 ENCOUNTER — Encounter: Payer: Self-pay | Admitting: Obstetrics and Gynecology

## 2017-07-27 ENCOUNTER — Other Ambulatory Visit: Payer: Self-pay | Admitting: Obstetrics and Gynecology

## 2017-07-27 DIAGNOSIS — O30111 Triplet pregnancy with two or more monochorionic fetuses, first trimester: Secondary | ICD-10-CM

## 2017-07-27 DIAGNOSIS — O30109 Triplet pregnancy, unspecified number of placenta and unspecified number of amniotic sacs, unspecified trimester: Secondary | ICD-10-CM | POA: Insufficient documentation

## 2017-07-27 LAB — PROTEIN / CREATININE RATIO, URINE
Creatinine, Urine: 245.6 mg/dL
PROTEIN/CREAT RATIO: 138 mg/g{creat} (ref 0–200)
Protein, Ur: 33.9 mg/dL

## 2017-07-27 LAB — COMPREHENSIVE METABOLIC PANEL
ALT: 17 IU/L (ref 0–32)
AST: 16 IU/L (ref 0–40)
Albumin/Globulin Ratio: 1.5 (ref 1.2–2.2)
Albumin: 3.5 g/dL (ref 3.5–5.5)
Alkaline Phosphatase: 68 IU/L (ref 39–117)
BUN/Creatinine Ratio: 18 (ref 9–23)
BUN: 11 mg/dL (ref 6–20)
CALCIUM: 9.2 mg/dL (ref 8.7–10.2)
CHLORIDE: 102 mmol/L (ref 96–106)
CO2: 19 mmol/L — AB (ref 20–29)
Creatinine, Ser: 0.6 mg/dL (ref 0.57–1.00)
GFR calc non Af Amer: 119 mL/min/{1.73_m2} (ref >59)
GFR, EST AFRICAN AMERICAN: 138 mL/min/{1.73_m2} (ref >59)
GLUCOSE: 77 mg/dL (ref 65–99)
Globulin, Total: 2.4 g/dL (ref 1.5–4.5)
Potassium: 3.6 mmol/L (ref 3.5–5.2)
Sodium: 137 mmol/L (ref 134–144)
TOTAL PROTEIN: 5.9 g/dL — AB (ref 6.0–8.5)

## 2017-07-27 LAB — HEMOGLOBIN A1C
Est. average glucose Bld gHb Est-mCnc: 103 mg/dL
HEMOGLOBIN A1C: 5.2 % (ref 4.8–5.6)

## 2017-07-29 ENCOUNTER — Encounter: Payer: Self-pay | Admitting: Obstetrics & Gynecology

## 2017-07-30 ENCOUNTER — Telehealth: Payer: Self-pay | Admitting: Obstetrics and Gynecology

## 2017-07-30 LAB — MATERNIT 21 PLUS CORE, BLOOD
CHROMOSOME 13: NEGATIVE
CHROMOSOME 18: NEGATIVE
CHROMOSOME 21: NEGATIVE
Y Chromosome: DETECTED

## 2017-07-30 NOTE — Telephone Encounter (Signed)
Rachel Cooley is calling from [facility] is calling to speak with a provider about patient care . Please call # [phone] or [phone]

## 2017-07-31 ENCOUNTER — Encounter: Payer: Self-pay | Admitting: Obstetrics and Gynecology

## 2017-08-23 ENCOUNTER — Encounter: Payer: BC Managed Care – PPO | Admitting: Obstetrics & Gynecology

## 2017-08-23 ENCOUNTER — Other Ambulatory Visit: Payer: BC Managed Care – PPO

## 2017-08-25 ENCOUNTER — Encounter: Payer: Self-pay | Admitting: Obstetrics & Gynecology

## 2017-10-11 DIAGNOSIS — O30032 Twin pregnancy, monochorionic/diamniotic, second trimester: Secondary | ICD-10-CM | POA: Diagnosis not present

## 2017-12-07 DIAGNOSIS — O1413 Severe pre-eclampsia, third trimester: Secondary | ICD-10-CM | POA: Insufficient documentation

## 2019-04-24 ENCOUNTER — Telehealth: Payer: Self-pay | Admitting: Family Medicine

## 2019-04-24 NOTE — Telephone Encounter (Signed)
Patient is calling to schedule a new patient appt with either Jolene. Please advise [phone]

## 2019-04-26 NOTE — Telephone Encounter (Signed)
Done

## 2019-04-28 ENCOUNTER — Encounter: Payer: Self-pay | Admitting: Nurse Practitioner

## 2019-04-28 ENCOUNTER — Ambulatory Visit (INDEPENDENT_AMBULATORY_CARE_PROVIDER_SITE_OTHER): Payer: Medicaid Other | Admitting: Nurse Practitioner

## 2019-04-28 ENCOUNTER — Other Ambulatory Visit: Payer: Self-pay

## 2019-04-28 VITALS — Ht 69.0 in | Wt 330.0 lb

## 2019-04-28 DIAGNOSIS — F411 Generalized anxiety disorder: Secondary | ICD-10-CM | POA: Insufficient documentation

## 2019-04-28 DIAGNOSIS — F330 Major depressive disorder, recurrent, mild: Secondary | ICD-10-CM | POA: Diagnosis not present

## 2019-04-28 DIAGNOSIS — K219 Gastro-esophageal reflux disease without esophagitis: Secondary | ICD-10-CM | POA: Insufficient documentation

## 2019-04-28 HISTORY — DX: Generalized anxiety disorder: F41.1

## 2019-04-28 MED ORDER — OMEPRAZOLE 40 MG PO CPDR: 40.0000 mg | DELAYED_RELEASE_CAPSULE | Freq: Every day | ORAL | 3 refills | Status: DC

## 2019-04-28 MED ORDER — BUSPIRONE HCL 5 MG PO TABS: 5.0000 mg | ORAL_TABLET | Freq: Two times a day (BID) | ORAL | 2 refills | Status: DC | PRN

## 2019-04-28 MED ORDER — VENLAFAXINE HCL ER 75 MG PO CP24: 75.0000 mg | ORAL_CAPSULE | Freq: Two times a day (BID) | ORAL | 3 refills | Status: DC

## 2019-04-28 NOTE — Patient Instructions (Signed)
Persistent Depressive Disorder  Persistent depressive disorder (PDD) is a mental health condition. PDD causes symptoms of low-level depression for 2 years or longer. It may also be called long-term (chronic) depression or dysthymia. PDD may include episodes of more severe depression that last for about 2 weeks (major depressive disorder or MDD). PDD can affect the way you think, feel, and sleep. This condition may also affect your relationships. You may be more likely to get sick if you have PDD. Symptoms of PDD occur for most of the day and may include:  Feeling tired (fatigue).  Low energy.  Eating too much or too little.  Sleeping too much or too little.  Feeling restless or agitated.  Feeling hopeless.  Feeling worthless or guilty.  Feeling worried or nervous (anxiety).  Trouble concentrating or making decisions.  Low self-esteem.  A negative way of looking at things (outlook).  Not being able to have fun or feel pleasure.  Avoiding interacting with people.  Getting angry or annoyed easily (irritability).  Acting aggressive or angry. Follow these instructions at home: Activity  Go back to your normal activities as told by your doctor.  Exercise regularly as told by your doctor. General instructions  Take over-the-counter and prescription medicines only as told by your doctor.  Do not drink alcohol. Or, limit how much alcohol you drink to no more than 1 drink a day for nonpregnant women and 2 drinks a day for men. One drink equals 12 oz of beer, 5 oz of wine, or 1 oz of hard liquor. Alcohol can affect any antidepressant medicines you are taking. Talk with your doctor about your alcohol use.  Eat a healthy diet and get plenty of sleep.  Find activities that you enjoy each day.  Consider joining a support group. Your doctor may be able to suggest a support group.  Keep all follow-up visits as told by your doctor. This is important. Where to find more  information National Alliance on Mental Illness  www.nami.org [place] National Institute of Mental Health  www.nimh.nih.gov National Suicide Prevention Lifeline  (1-800-273-8255).  This is free, 24-hour help. Contact a doctor if:  Your symptoms get worse.  You have new symptoms.  You have trouble sleeping or doing your daily activities. Get help right away if:  You self-harm.  You have serious thoughts about hurting yourself or others.  You see, hear, taste, smell, or feel things that are not there (hallucinate). This information is not intended to replace advice given to you by your health care provider. Make sure you discuss any questions you have with your health care provider. Document Released: 09/02/2015 Document Revised: 09/03/2017 Document Reviewed: 05/15/2016 Elsevier Patient Education  2020 Elsevier Inc.  

## 2019-04-28 NOTE — Assessment & Plan Note (Signed)
Chronic, ongoing with exacerbation due to Covid 19.  Continue current medication regimen, Wellbutrin and Effexor.  Refill on Effexor sent.  Start Buspar 5 MG twice a day as needed, script sent.  She denies SI/HI.  Referral for therapy sent.  Return in 4 weeks for physical.
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
## 2019-04-28 NOTE — Assessment & Plan Note (Signed)
Chronic, ongoing.  Continue current medication regimen, Wellbutrin and Effexor.  Effexor refill sent. Consider referral to GYN for discussion of alternate methods BCP than paraguard that may benefit mood, if ongoing symptoms.  She denies SI/HI.  Return in 4 weeks for physical.

## 2019-06-09 ENCOUNTER — Encounter: Payer: Medicaid Other | Admitting: Nurse Practitioner

## 2019-06-20 ENCOUNTER — Other Ambulatory Visit: Payer: Self-pay | Admitting: Nurse Practitioner

## 2019-06-20 MED ORDER — BUPROPION HCL ER (XL) 150 MG PO TB24: 150.0000 mg | ORAL_TABLET | Freq: Every day | ORAL | 3 refills | Status: DC

## 2019-06-23 ENCOUNTER — Other Ambulatory Visit: Payer: Self-pay

## 2019-06-23 ENCOUNTER — Ambulatory Visit (INDEPENDENT_AMBULATORY_CARE_PROVIDER_SITE_OTHER): Payer: Medicaid Other | Admitting: Nurse Practitioner

## 2019-06-23 ENCOUNTER — Encounter: Payer: Self-pay | Admitting: Nurse Practitioner

## 2019-06-23 VITALS — BP 115/80 | HR 82 | Temp 98.3°F | Ht 68.2 in | Wt 331.6 lb

## 2019-06-23 DIAGNOSIS — Z0000 Encounter for general adult medical examination without abnormal findings: Secondary | ICD-10-CM | POA: Diagnosis not present

## 2019-06-23 DIAGNOSIS — F330 Major depressive disorder, recurrent, mild: Secondary | ICD-10-CM

## 2019-06-23 DIAGNOSIS — F411 Generalized anxiety disorder: Secondary | ICD-10-CM

## 2019-06-23 DIAGNOSIS — Z6841 Body Mass Index (BMI) 40.0 and over, adult: Secondary | ICD-10-CM | POA: Insufficient documentation

## 2019-06-23 NOTE — Assessment & Plan Note (Addendum)
Chronic, ongoing.  Reports significant improvement with occasional Buspar.  Continue current medication regimen and adjust as needed.  Improvement noted in GAD score noted.  Obtain baseline labs today.  Return in 6 months.

## 2019-06-23 NOTE — Assessment & Plan Note (Signed)
Chronic, ongoing.  Denies SI/HI.  Reports significant improvement with occasional Buspar.  Continue current medication regimen and adjust as needed.  Improvement noted in GAD and PHQ9 scores.  Obtain baseline labs today.  Return in 6 months.

## 2019-06-23 NOTE — Assessment & Plan Note (Signed)
Would like referral to weight management group to discuss options such as medication or surgery to help with loss.

## 2019-06-23 NOTE — Progress Notes (Signed)
BP 115/80   Pulse 82   Temp 98.3 F (36.8 C) (Oral)   Ht 5' 8.2" (1.732 m)   Wt (!) 331 lb 9.6 oz (150.4 kg)   LMP 06/20/2019 (Exact Date)   SpO2 98%   BMI 50.12 kg/m    Subjective:    Patient ID: Rachel Cooley, female    DOB: 1981-04-11, 38 y.o.   MRN: [id number]  HPI: Rachel Cooley is a 38 y.o. female presenting on 06/23/2019 for comprehensive medical examination. Current medical complaints include:none  She currently lives with: husband and children Menopausal Symptoms: no   OBESITY: She discussed frustration today with weight loss.  Walks 3 miles a day pushing her 718 month old children in stroller and is active, also focuses on diet.  Currently working on keto diet.  She would like referral to discuss weight loss options that may be covered by her insurance, medications or even surgery.  DEPRESSION/ANXIETY Added Buspar PRN to regimen at recent visit.  She continues on Effexor and Wellbutrin.  Endorses stress as mom with young children at home.  She reports Buspar is helping "a lot" with her moments of anxiety, takes as needed. Mood status: stable Satisfied with current treatment?: yes Symptom severity: mild  Duration of current treatment : chronic Side effects: no Medication compliance: good compliance Psychotherapy/counseling: yes current Previous psychiatric medications: Buspar, Wellbutrin, Effexor Depressed mood: no Anxious mood: no Anhedonia: no Significant weight loss or gain: no Insomnia: none Fatigue: no Feelings of worthlessness or guilt: no Impaired concentration/indecisiveness: no Suicidal ideations: no Hopelessness: no Crying spells: no [facility] screen [facility] 06/23/2019 04/28/2019  Decreased Interest 0 1  Down, Depressed, Hopeless 0 1  PHQ - 2 Score 0 2  Altered sleeping 0 1  Tired, decreased energy 0 0  Change in appetite 0 1  Feeling bad or failure about yourself  0 1  Trouble concentrating 0 0  Moving slowly or fidgety/restless 0 0   Suicidal thoughts 0 0  PHQ-9 Score 0 5  Difficult doing work/chores Not difficult at all Somewhat difficult   GAD 7 : Generalized Anxiety Score 06/23/2019 04/28/2019  Nervous, Anxious, on Edge 0 1  Control/stop worrying 0 0  Worry too much - different things 0 1  Trouble relaxing 0 1  Restless 0 0  Easily annoyed or irritable 0 1  Afraid - awful might happen 0 0  Total GAD 7 Score 0 4  Anxiety Difficulty Not difficult at all Somewhat difficult    The patient does not have a history of falls. I did not complete a risk assessment for falls. A plan of care for falls was not documented.   Past Medical History:  Past Medical History:  Diagnosis Date  . Anxiety   . Back muscle spasm   . Chronic low back pain   . Depression   . GERD (gastroesophageal reflux disease)   . GERD (gastroesophageal reflux disease)   . Maternal morbid obesity, antepartum (HCC)   . 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 Obesity   . Preeclampsia, severe, third trimester     Surgical History:  Past Surgical History:  Procedure Laterality Date  . NO PAST SURGERIES      Medications:  Current Outpatient Medications on File Prior to Visit  Medication Sig  . aspirin 81 MG chewable tablet Chew by mouth.  Marland Kitchen. buPROPion (WELLBUTRIN XL) 150 MG 24 hr tablet Take 1 tablet (150 mg total) by mouth daily.  .Marland Kitchen  busPIRone (BUSPAR) 5 MG tablet TAKE 1 TABLET (5 MG TOTAL) BY MOUTH 2 (TWO) TIMES DAILY AS NEEDED (FOR ANXIETY).  Marland Kitchen. cetirizine (ZYRTEC) 10 MG tablet Take by mouth.  . Multiple Vitamin (MULTIVITAMIN) tablet Take 1 tablet by mouth daily.  Marland Kitchen. omeprazole (PRILOSEC) 40 MG capsule Take 1 capsule (40 mg total) by mouth daily.  Marland Kitchen. venlafaxine XR (EFFEXOR-XR) 75 MG 24 hr capsule Take 1 capsule (75 mg total) by mouth 2 (two) times a day.   No current facility-administered medications on file prior to visit.     Allergies:  No Known Allergies  Social History:  Social History   Socioeconomic  History  . Marital status: Single    Spouse name: Not on file  . Number of children: 2  . Years of education: Not on file  . Highest education level: Not on file  Occupational History  . Not on file  Social Needs  . Financial resource strain: Not very hard  . Food insecurity    Worry: Never true    Inability: Never true  . Transportation needs    Medical: No    Non-medical: No  Tobacco Use  . Smoking status: Former Smoker    Types: Cigarettes  . Smokeless tobacco: Never Used  . Tobacco comment: stopped with +UPT  Substance and Sexual Activity  . Alcohol use: No  . Drug use: No  . Sexual activity: Yes    Partners: Male    Birth control/protection: Pill  Lifestyle  . Physical activity    Days per week: 5 days    Minutes per session: 30 min  . Stress: To some extent  Relationships  . Social Musicianconnections    Talks on phone: Three times a week    Gets together: Three times a week    Attends religious service: Not on file    Active member of club or organization: Not on file    Attends meetings of clubs or organizations: Not on file    Relationship status: Married  . Intimate partner violence    Fear of current or ex partner: No    Emotionally abused: No    Physically abused: No    Forced sexual activity: No  Other Topics Concern  . Not on file  Social History Narrative  . Not on file   Social History   Tobacco Use  Smoking Status Former Smoker  . Types: Cigarettes  Smokeless Tobacco Never Used  Tobacco Comment   stopped with +UPT   Social History   Substance and Sexual Activity  Alcohol Use No    Family History:  Family History  Problem Relation Age of Onset  . Hypertension Mother   . Hyperlipidemia Mother   . Arthritis Mother   . Diabetes Mother   . Hypertension Father   . Arthritis Father   . Brain cancer Maternal Grandmother   . Lung cancer Maternal Grandfather   . Brain cancer Paternal Grandfather   . Stroke Neg Hx     Past medical history,  surgical history, medications, allergies, family history and social history reviewed with patient today and changes made to appropriate areas of the chart.   Review of Systems - negative All other ROS negative except what is listed above and in the HPI.      Objective:    BP 115/80   Pulse 82   Temp 98.3 F (36.8 C) (Oral)   Ht 5' 8.2" (1.732 m)   Wt (!) 331 lb 9.6  oz (150.4 kg)   LMP 06/20/2019 (Exact Date)   SpO2 98%   BMI 50.12 kg/m   Wt Readings from Last 3 Encounters:  06/23/19 (!) 331 lb 9.6 oz (150.4 kg)  04/28/19 (!) 330 lb (149.7 kg)  07/26/17 (!) 302 lb (137 kg)    Physical Exam Vitals signs and nursing note reviewed.  Constitutional:      General: She is awake. She is not in acute distress.    Appearance: She is well-developed. She is morbidly obese. She is not ill-appearing.  HENT:     Head: Normocephalic.     Right Ear: Hearing, tympanic membrane, ear canal and external ear normal. No drainage.     Left Ear: Hearing, tympanic membrane, ear canal and external ear normal. No drainage.     Nose: Nose normal.     Mouth/Throat:     Mouth: Mucous membranes are moist.  Eyes:     General: Lids are normal.        Right eye: No discharge.        Left eye: No discharge.     Extraocular Movements: Extraocular movements intact.     Conjunctiva/sclera: Conjunctivae normal.     Pupils: Pupils are equal, round, and reactive to light.     Visual Fields: Right eye visual fields normal and left eye visual fields normal.  Neck:     Musculoskeletal: Normal range of motion and neck supple.     Thyroid: No thyromegaly.     Vascular: No carotid bruit.  Cardiovascular:     Rate and Rhythm: Normal rate and regular rhythm.     Heart sounds: Normal heart sounds. No murmur. No gallop.   Pulmonary:     Effort: Pulmonary effort is normal. No accessory muscle usage or respiratory distress.     Breath sounds: Normal breath sounds.  Chest:     Breasts:        Right: Normal. No  swelling, bleeding, inverted nipple, mass, nipple discharge, skin change or tenderness.        Left: Normal. No swelling, bleeding, inverted nipple, mass, nipple discharge, skin change or tenderness.  Abdominal:     General: Bowel sounds are normal.     Palpations: Abdomen is soft. There is no hepatomegaly or splenomegaly.     Tenderness: There is no abdominal tenderness.  Musculoskeletal:     Right lower leg: No edema.     Left lower leg: No edema.  Lymphadenopathy:     Head:     Right side of head: No submental, submandibular, tonsillar, preauricular, posterior auricular or occipital adenopathy.     Left side of head: No submental, submandibular, tonsillar, preauricular, posterior auricular or occipital adenopathy.     Cervical: No cervical adenopathy.     Upper Body:     Right upper body: No supraclavicular, axillary or pectoral adenopathy.     Left upper body: No supraclavicular, axillary or pectoral adenopathy.  Skin:    General: Skin is warm and dry.     Capillary Refill: Capillary refill takes less than 2 seconds.     Findings: No rash.  Neurological:     Mental Status: She is alert and oriented to person, place, and time.     Cranial Nerves: Cranial nerves are intact.     Gait: Gait is intact.     Deep Tendon Reflexes:     Reflex Scores:      Brachioradialis reflexes are 1+ on the right side and 1+  on the left side.      Patellar reflexes are 1+ on the right side and 1+ on the left side. Psychiatric:        Attention and Perception: Attention normal.        Mood and Affect: Mood normal.        Speech: Speech normal.        Behavior: Behavior normal. Behavior is cooperative.        Thought Content: Thought content normal.        Judgment: Judgment normal.     Results for orders placed or performed in visit on 07/26/17  MaterniT 21 plus Core, Blood  Result Value Ref Range   Chromosome 21 Negative    Chromosome 18 Negative    Chromosome 13 Negative    Interpretation  Comment    Y Chromosome Detected    Y Chromosome Interpretation Comment    Additional Findings N/A    Add'l Findings Interpretation N/A    Additional Findings Note N/A    Lab Director Comments Comment    Approved By Comment    Test Method Comment    About the Test Comment    Performance Comment    Performance Data Comment    Limitations of the Test Comment    Note: MaterniT 21 plus Comment    References Comment   Hemoglobin A1c  Result Value Ref Range   Hgb A1c MFr Bld 5.2 4.8 - 5.6 %   Est. average glucose Bld gHb Est-mCnc 103 mg/dL  Comprehensive metabolic panel  Result Value Ref Range   Glucose 77 65 - 99 mg/dL   BUN 11 6 - 20 mg/dL   Creatinine, Ser 4.62 0.57 - 1.00 mg/dL   GFR calc non Af Amer 119 >59 mL/min/1.73   GFR calc Af Amer 138 >59 mL/min/1.73   BUN/Creatinine Ratio 18 9 - 23   Sodium 137 134 - 144 mmol/L   Potassium 3.6 3.5 - 5.2 mmol/L   Chloride 102 96 - 106 mmol/L   CO2 19 (L) 20 - 29 mmol/L   Calcium 9.2 8.7 - 10.2 mg/dL   Total Protein 5.9 (L) 6.0 - 8.5 g/dL   Albumin 3.5 3.5 - 5.5 g/dL   Globulin, Total 2.4 1.5 - 4.5 g/dL   Albumin/Globulin Ratio 1.5 1.2 - 2.2   Bilirubin Total <0.2 0.0 - 1.2 mg/dL   Alkaline Phosphatase 68 39 - 117 IU/L   AST 16 0 - 40 IU/L   ALT 17 0 - 32 IU/L  Protein / creatinine ratio, urine  Result Value Ref Range   Creatinine, Urine 245.6 Not Estab. mg/dL   Protein, Ur 86.3 Not Estab. mg/dL   Protein/Creat Ratio 817 0 - 200 mg/g creat      Assessment & Plan:   Problem List Items Addressed This Visit      Other   Mild episode of recurrent major depressive disorder (HCC)    Chronic, ongoing.  Denies SI/HI.  Reports significant improvement with occasional Buspar.  Continue current medication regimen and adjust as needed.  Improvement noted in GAD and PHQ9 scores.  Obtain baseline labs today.  Return in 6 months.      Relevant Orders   TSH   Generalized anxiety disorder    Chronic, ongoing.  Reports significant  improvement with occasional Buspar.  Continue current medication regimen and adjust as needed.  Improvement noted in GAD score noted.  Obtain baseline labs today.  Return in 6 months.  Morbid obesity ([place])    Would like referral to weight management group to discuss options such as medication or surgery to help with loss.      Relevant Orders   Lipid Panel w/o Chol/HDL Ratio   Comprehensive metabolic panel   TSH   Amb Ref to Medical Weight Management    Other Visit Diagnoses    Encounter for annual physical exam    -  Primary   Obtain baseline labs today to include CBC, CMP, TSH, lipid panel   Relevant Orders   Lipid Panel w/o Chol/HDL Ratio   Comprehensive metabolic panel   CBC with Differential/Platelet   TSH       Follow up plan: Return in about 6 months (around 12/21/2019) for Depression and Anxiety.   LABORATORY TESTING:  - Pap smear: December 2018  IMMUNIZATIONS:   - Tdap: Tetanus vaccination status reviewed: last tetanus booster within 10 years. - Influenza: Up to date - Pneumovax: Not applicable - Prevnar: Not applicable - HPV: Not applicable - Zostavax vaccine: Not applicable  SCREENING: -Mammogram: Not applicable  - Colonoscopy: Not applicable  - Bone Density: Not applicable  -Hearing Test: Not applicable  -Spirometry: Not applicable   PATIENT COUNSELING:   Advised to take 1 mg of folate supplement per day if capable of pregnancy.   Sexuality: Discussed sexually transmitted diseases, partner selection, use of condoms, avoidance of unintended pregnancy  and contraceptive alternatives.   Advised to avoid cigarette smoking.  I discussed with the patient that most people either abstain from alcohol or drink within safe limits (<=14/week and <=4 drinks/occasion for males, <=7/weeks and <= 3 drinks/occasion for females) and that the risk for alcohol disorders and other health effects rises proportionally with the number of drinks per week and how often a  drinker exceeds daily limits.  Discussed cessation/primary prevention of drug use and availability of treatment for abuse.   Diet: Encouraged to adjust caloric intake to maintain  or achieve ideal body weight, to reduce intake of dietary saturated fat and total fat, to limit sodium intake by avoiding high sodium foods and not adding table salt, and to maintain adequate dietary potassium and calcium preferably from fresh fruits, vegetables, and low-fat dairy products.    stressed the importance of regular exercise  Injury prevention: Discussed safety belts, safety helmets, smoke detector, smoking near bedding or upholstery.   Dental health: Discussed importance of regular tooth brushing, flossing, and dental visits.    NEXT PREVENTATIVE PHYSICAL DUE IN 1 YEAR. Return in about 6 months (around 12/21/2019) for Depression and Anxiety.

## 2019-06-23 NOTE — Patient Instructions (Signed)
Living With Depression Everyone experiences occasional disappointment, sadness, and loss in their lives. When you are feeling down, blue, or sad for at least 2 weeks in a row, it may mean that you have depression. Depression can affect your thoughts and feelings, relationships, daily activities, and physical health. It is caused by changes in the way your brain functions. If you receive a diagnosis of depression, your health care provider will tell you which type of depression you have and what treatment options are available to you. If you are living with depression, there are ways to help you recover from it and also ways to prevent it from coming back. How to cope with lifestyle changes Coping with stress     Stress is your body's reaction to life changes and events, both good and bad. Stressful situations may include:  Getting married.  The death of a spouse.  Losing a job.  Retiring.  Having a baby. Stress can last just a few hours or it can be ongoing. Stress can play a major role in depression, so it is important to learn both how to cope with stress and how to think about it differently. Talk with your health care provider or a counselor if you would like to learn more about stress reduction. He or she may suggest some stress reduction techniques, such as:  Music therapy. This can include creating music or listening to music. Choose music that you enjoy and that inspires you.  Mindfulness-based meditation. This kind of meditation can be done while sitting or walking. It involves being aware of your normal breaths, rather than trying to control your breathing.  Centering prayer. This is a kind of meditation that involves focusing on a spiritual word or phrase. Choose a word, phrase, or sacred image that is meaningful to you and that brings you peace.  Deep breathing. To do this, expand your stomach and inhale slowly through your nose. Hold your breath for 3-5 seconds, then exhale  slowly, allowing your stomach muscles to relax.  Muscle relaxation. This involves intentionally tensing muscles then relaxing them. Choose a stress reduction technique that fits your lifestyle and personality. Stress reduction techniques take time and practice to develop. Set aside 5-15 minutes a day to do them. Therapists can offer training in these techniques. The training may be covered by some insurance plans. Other things you can do to manage stress include:  Keeping a stress diary. This can help you learn what triggers your stress and ways to control your response.  Understanding what your limits are and saying no to requests or events that lead to a schedule that is too full.  Thinking about how you respond to certain situations. You may not be able to control everything, but you can control how you react.  Adding humor to your life by watching funny films or TV shows.  Making time for activities that help you relax and not feeling guilty about spending your time this way.  Medicines Your health care provider may suggest certain medicines if he or she feels that they will help improve your condition. Avoid using alcohol and other substances that may prevent your medicines from working properly (may interact). It is also important to:  Talk with your pharmacist or health care provider about all the medicines that you take, their possible side effects, and what medicines are safe to take together.  Make it your goal to take part in all treatment decisions (shared decision-making). This includes giving input on   the side effects of medicines. It is best if shared decision-making with your health care provider is part of your total treatment plan. If your health care provider prescribes a medicine, you may not notice the full benefits of it for 4-8 weeks. Most people who are treated for depression need to be on medicine for at least 6-12 months after they feel better. If you are taking  medicines as part of your treatment, do not stop taking medicines without first talking to your health care provider. You may need to have the medicine slowly decreased (tapered) over time to decrease the risk of harmful side effects. Relationships Your health care provider may suggest family therapy along with individual therapy and drug therapy. While there may not be family problems that are causing you to feel depressed, it is still important to make sure your family learns as much as they can about your mental health. Having your family's support can help make your treatment successful. How to recognize changes in your condition Everyone has a different response to treatment for depression. Recovery from major depression happens when you have not had signs of major depression for two months. This may mean that you will start to:  Have more interest in doing activities.  Feel less hopeless than you did 2 months ago.  Have more energy.  Overeat less often, or have better or improving appetite.  Have better concentration. Your health care provider will work with you to decide the next steps in your recovery. It is also important to recognize when your condition is getting worse. Watch for these signs:  Having fatigue or low energy.  Eating too much or too little.  Sleeping too much or too little.  Feeling restless, agitated, or hopeless.  Having trouble concentrating or making decisions.  Having unexplained physical complaints.  Feeling irritable, angry, or aggressive. Get help as soon as you or your family members notice these symptoms coming back. How to get support and help from others How to talk with friends and family members about your condition  Talking to friends and family members about your condition can provide you with one way to get support and guidance. Reach out to trusted friends or family members, explain your symptoms to them, and let them know that you are  working with a health care provider to treat your depression. Financial resources Not all insurance plans cover mental health care, so it is important to check with your insurance carrier. If paying for co-pays or counseling services is a problem, search for a local or county mental health care center. They may be able to offer public mental health care services at low or no cost when you are not able to see a private health care provider. If you are taking medicine for depression, you may be able to get the generic form, which may be less expensive. Some makers of prescription medicines also offer help to patients who cannot afford the medicines they need. Follow these instructions at home:   Get the right amount and quality of sleep.  Cut down on using caffeine, tobacco, alcohol, and other potentially harmful substances.  Try to exercise, such as walking or lifting small weights.  Take over-the-counter and prescription medicines only as told by your health care provider.  Eat a healthy diet that includes plenty of vegetables, fruits, whole grains, low-fat dairy products, and lean protein. Do not eat a lot of foods that are high in solid fats, added sugars, or salt.    Keep all follow-up visits as told by your health care provider. This is important. Contact a health care provider if:  You stop taking your antidepressant medicines, and you have any of these symptoms: ? Nausea. ? Headache. ? Feeling lightheaded. ? Chills and body aches. ? Not being able to sleep (insomnia).  You or your friends and family think your depression is getting worse. Get help right away if:  You have thoughts of hurting yourself or others. If you ever feel like you may hurt yourself or others, or have thoughts about taking your own life, get help right away. You can go to your nearest emergency department or call:  Your local emergency services (911 in the [place]  A suicide crisis helpline, such as the  National Suicide Prevention Lifeline at 1-800-273-8255. This is open 24-hours a day. Summary  If you are living with depression, there are ways to help you recover from it and also ways to prevent it from coming back.  Work with your health care team to create a management plan that includes counseling, stress management techniques, and healthy lifestyle habits. This information is not intended to replace advice given to you by your health care provider. Make sure you discuss any questions you have with your health care provider. Document Released: 08/24/2016 Document Revised: 01/13/2019 Document Reviewed: 08/24/2016 Elsevier Patient Education  2020 [employer]  

## 2019-06-24 LAB — LIPID PANEL W/O CHOL/HDL RATIO
Cholesterol, Total: 237 mg/dL — ABNORMAL HIGH (ref 100–199)
HDL: 46 mg/dL (ref >39)
LDL Chol Calc (NIH): 161 mg/dL — ABNORMAL HIGH (ref 0–99)
Triglycerides: 167 mg/dL — ABNORMAL HIGH (ref 0–149)
VLDL Cholesterol Cal: 30 mg/dL (ref 5–40)

## 2019-06-24 LAB — CBC WITH DIFFERENTIAL/PLATELET
Basophils Absolute: 0.1 10*3/uL (ref 0.0–0.2)
Basos: 1 %
EOS (ABSOLUTE): 0.1 10*3/uL (ref 0.0–0.4)
Eos: 2 %
Hematocrit: 36.6 % (ref 34.0–46.6)
Hemoglobin: 11.8 g/dL (ref 11.1–15.9)
Immature Grans (Abs): 0 10*3/uL (ref 0.0–0.1)
Immature Granulocytes: 0 %
Lymphocytes Absolute: 1.9 10*3/uL (ref 0.7–3.1)
Lymphs: 24 %
MCH: 27.2 pg (ref 26.6–33.0)
MCHC: 32.2 g/dL (ref 31.5–35.7)
MCV: 84 fL (ref 79–97)
Monocytes Absolute: 0.4 10*3/uL (ref 0.1–0.9)
Monocytes: 5 %
Neutrophils Absolute: 5.3 10*3/uL (ref 1.4–7.0)
Neutrophils: 68 %
Platelets: 249 10*3/uL (ref 150–450)
RBC: 4.34 x10E6/uL (ref 3.77–5.28)
RDW: 14.7 % (ref 11.7–15.4)
WBC: 7.9 10*3/uL (ref 3.4–10.8)

## 2019-06-24 LAB — COMPREHENSIVE METABOLIC PANEL
ALT: 31 IU/L (ref 0–32)
AST: 23 IU/L (ref 0–40)
Albumin/Globulin Ratio: 1.8 (ref 1.2–2.2)
Albumin: 4.4 g/dL (ref 3.8–4.8)
Alkaline Phosphatase: 68 IU/L (ref 39–117)
BUN/Creatinine Ratio: 31 — ABNORMAL HIGH (ref 9–23)
BUN: 21 mg/dL — ABNORMAL HIGH (ref 6–20)
Bilirubin Total: 0.3 mg/dL (ref 0.0–1.2)
CO2: 22 mmol/L (ref 20–29)
Calcium: 9.3 mg/dL (ref 8.7–10.2)
Chloride: 102 mmol/L (ref 96–106)
Creatinine, Ser: 0.68 mg/dL (ref 0.57–1.00)
GFR calc Af Amer: 130 mL/min/{1.73_m2} (ref >59)
GFR calc non Af Amer: 113 mL/min/{1.73_m2} (ref >59)
Globulin, Total: 2.5 g/dL (ref 1.5–4.5)
Glucose: 87 mg/dL (ref 65–99)
Potassium: 4.6 mmol/L (ref 3.5–5.2)
Sodium: 138 mmol/L (ref 134–144)
Total Protein: 6.9 g/dL (ref 6.0–8.5)

## 2019-06-24 LAB — TSH: TSH: 2.27 u[IU]/mL (ref 0.450–4.500)

## 2019-11-14 ENCOUNTER — Other Ambulatory Visit: Payer: Self-pay | Admitting: Nurse Practitioner

## 2019-12-22 ENCOUNTER — Ambulatory Visit: Payer: Medicaid Other | Admitting: Unknown Physician Specialty

## 2019-12-25 ENCOUNTER — Encounter: Payer: Self-pay | Admitting: Nurse Practitioner

## 2019-12-25 ENCOUNTER — Telehealth (INDEPENDENT_AMBULATORY_CARE_PROVIDER_SITE_OTHER): Payer: Medicaid Other | Admitting: Nurse Practitioner

## 2019-12-25 ENCOUNTER — Ambulatory Visit: Payer: Medicaid Other | Admitting: Nurse Practitioner

## 2019-12-25 DIAGNOSIS — F411 Generalized anxiety disorder: Secondary | ICD-10-CM | POA: Diagnosis not present

## 2019-12-25 DIAGNOSIS — F330 Major depressive disorder, recurrent, mild: Secondary | ICD-10-CM | POA: Diagnosis not present

## 2019-12-25 MED ORDER — BUSPIRONE HCL 5 MG PO TABS: 5.0000 mg | ORAL_TABLET | Freq: Two times a day (BID) | ORAL | 2 refills | Status: DC | PRN

## 2019-12-25 NOTE — Assessment & Plan Note (Signed)
Chronic, ongoing.  Reports significant improvement with occasional Buspar.  Continue current medication regimen and adjust as needed.  GAD score stable. Return in 6 months.

## 2019-12-25 NOTE — Progress Notes (Signed)
There were no vitals taken for this visit.   Subjective:    Patient ID: Rachel Cooley, female    DOB: 1980/11/21, 39 y.o.   MRN: [id number]  HPI: DELBRA Cooley is a 39 y.o. female  Chief Complaint  Patient presents with  . Anxiety    . This visit was completed via MyChart due to the restrictions of the COVID-19 pandemic. All issues as above were discussed and addressed. Physical exam was done as above through visual confirmation on MyChart. If it was felt that the patient should be evaluated in the office, they were directed there. The patient verbally consented to this visit. . Location of the patient: home . Location of the provider: work . Those involved with this call:  . Provider: Marnee Guarneri, DNP . CMA: Yvonna Alanis, CMA . Front Desk/Registration: Don Perking  . Time spent on call: 15 minutes with patient face to face via video conference. More than 50% of this time was spent in counseling and coordination of care. 10 minutes total spent in review of patient's record and preparation of their chart.  . I verified patient identity using two factors (patient name and date of birth). Patient consents verbally to being seen via telemedicine visit today.    ANXIETY/STRESS Continues on Wellbutrin XL 150 MG daily, Effexor XR 75 MG BID, and Buspar 5 MG BID as needed only (takes this rarely).  Reports having some days where she is annoyed and aggravated and some days hard to get to sleep, these are only occasional.    Had referral placed to weight management in November and never heard from anyone. Duration:stable Anxious mood: rare  Excessive worrying: no Irritability: no  Sweating: no Nausea: no Palpitations:no Hyperventilation: no Panic attacks: no Agoraphobia: no  Obscessions/compulsions: no Depressed mood: rare Depression screen [facility] 2/9 12/25/2019 06/23/2019 04/28/2019  Decreased Interest 0 0 1  Down, Depressed, Hopeless 0 0 1  PHQ - 2 Score 0 0 2   Altered sleeping 2 0 1  Tired, decreased energy 1 0 0  Change in appetite 1 0 1  Feeling bad or failure about yourself  0 0 1  Trouble concentrating 0 0 0  Moving slowly or fidgety/restless 0 0 0  Suicidal thoughts 0 0 0  PHQ-9 Score 4 0 5  Difficult doing work/chores Somewhat difficult Not difficult at all Somewhat difficult   Anhedonia: no Weight changes: no Insomnia: yes hard to fall asleep  Hypersomnia: no Fatigue/loss of energy: no Feelings of worthlessness: no Feelings of guilt: no Impaired concentration/indecisiveness: no Suicidal ideations: no  Crying spells: no Recent Stressors/Life Changes: no   Relationship problems: no   Family stress: no     Financial stress: no    Job stress: no    Recent death/loss: no GAD 7 : Generalized Anxiety Score 12/25/2019 06/23/2019 04/28/2019  Nervous, Anxious, on Edge 0 0 1  Control/stop worrying 0 0 0  Worry too much - different things 0 0 1  Trouble relaxing 1 0 1  Restless 0 0 0  Easily annoyed or irritable 1 0 1  Afraid - awful might happen 0 0 0  Total GAD 7 Score 2 0 4  Anxiety Difficulty Not difficult at all Not difficult at all Somewhat difficult    Relevant past medical, surgical, family and social history reviewed and updated as indicated. Interim medical history since our last visit reviewed. Allergies and medications reviewed and updated.  Review of Systems  Constitutional: Negative for  activity change, appetite change, diaphoresis, fatigue and fever.  Respiratory: Negative for cough, chest tightness and shortness of breath.   Cardiovascular: Negative for chest pain, palpitations and leg swelling.  Gastrointestinal: Negative.   Neurological: Negative.   Psychiatric/Behavioral: Positive for sleep disturbance (occasional). Negative for decreased concentration, self-injury and suicidal ideas. The patient is not nervous/anxious.     Per HPI unless specifically indicated above     Objective:    There were no vitals  taken for this visit.  Wt Readings from Last 3 Encounters:  06/23/19 (!) 331 lb 9.6 oz (150.4 kg)  04/28/19 (!) 330 lb (149.7 kg)  07/26/17 (!) 302 lb (137 kg)    Physical Exam Vitals and nursing note reviewed.  Constitutional:      General: She is awake. She is not in acute distress.    Appearance: She is well-developed. She is obese. She is not ill-appearing.  HENT:     Head: Normocephalic.     Right Ear: Hearing normal.     Left Ear: Hearing normal.  Eyes:     General: Lids are normal.        Right eye: No discharge.        Left eye: No discharge.     Conjunctiva/sclera: Conjunctivae normal.  Pulmonary:     Effort: Pulmonary effort is normal. No accessory muscle usage or respiratory distress.  Musculoskeletal:     Cervical back: Normal range of motion.  Neurological:     Mental Status: She is alert and oriented to person, place, and time.  Psychiatric:        Attention and Perception: Attention normal.        Mood and Affect: Mood normal.        Behavior: Behavior normal. Behavior is cooperative.        Thought Content: Thought content normal.        Judgment: Judgment normal.     Results for orders placed or performed in visit on 06/23/19  Lipid Panel w/o Chol/HDL Ratio  Result Value Ref Range   Cholesterol, Total 237 (H) 100 - 199 mg/dL   Triglycerides 161 (H) 0 - 149 mg/dL   HDL 46 >09 mg/dL   VLDL Cholesterol Cal 30 5 - 40 mg/dL   LDL Chol Calc (NIH) 604 (H) 0 - 99 mg/dL  Comprehensive metabolic panel  Result Value Ref Range   Glucose 87 65 - 99 mg/dL   BUN 21 (H) 6 - 20 mg/dL   Creatinine, Ser 5.40 0.57 - 1.00 mg/dL   GFR calc non Af Amer 113 >59 mL/min/1.73   GFR calc Af Amer 130 >59 mL/min/1.73   BUN/Creatinine Ratio 31 (H) 9 - 23   Sodium 138 134 - 144 mmol/L   Potassium 4.6 3.5 - 5.2 mmol/L   Chloride 102 96 - 106 mmol/L   CO2 22 20 - 29 mmol/L   Calcium 9.3 8.7 - 10.2 mg/dL   Total Protein 6.9 6.0 - 8.5 g/dL   Albumin 4.4 3.8 - 4.8 g/dL    Globulin, Total 2.5 1.5 - 4.5 g/dL   Albumin/Globulin Ratio 1.8 1.2 - 2.2   Bilirubin Total 0.3 0.0 - 1.2 mg/dL   Alkaline Phosphatase 68 39 - 117 IU/L   AST 23 0 - 40 IU/L   ALT 31 0 - 32 IU/L  CBC with Differential/Platelet  Result Value Ref Range   WBC 7.9 3.4 - 10.8 x10E3/uL   RBC 4.34 3.77 - 5.28 x10E6/uL   Hemoglobin  11.8 11.1 - 15.9 g/dL   Hematocrit 24.0 97.3 - 46.6 %   MCV 84 79 - 97 fL   MCH 27.2 26.6 - 33.0 pg   MCHC 32.2 31.5 - 35.7 g/dL   RDW 53.2 99.2 - 42.6 %   Platelets 249 150 - 450 x10E3/uL   Neutrophils 68 Not Estab. %   Lymphs 24 Not Estab. %   Monocytes 5 Not Estab. %   Eos 2 Not Estab. %   Basos 1 Not Estab. %   Neutrophils Absolute 5.3 1.4 - 7.0 x10E3/uL   Lymphocytes Absolute 1.9 0.7 - 3.1 x10E3/uL   Monocytes Absolute 0.4 0.1 - 0.9 x10E3/uL   EOS (ABSOLUTE) 0.1 0.0 - 0.4 x10E3/uL   Basophils Absolute 0.1 0.0 - 0.2 x10E3/uL   Immature Granulocytes 0 Not Estab. 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  Denies SI/HI.  Reports significant improvement with occasional Buspar.  Continue current medication regimen and adjust as needed.  PHQ and GAD scores remain stable.  Return in 6 months.      Relevant Medications   busPIRone (BUSPAR) 5 MG tablet   Generalized anxiety disorder    Chronic, ongoing.  Reports significant improvement with occasional Buspar.  Continue current medication regimen and adjust as needed.  GAD score stable. Return in 6 months.      Relevant Medications   busPIRone (BUSPAR) 5 MG tablet   Morbid obesity (HCC)    New referral placed to weight management, as patient did not hear from anyone on first referral.      Relevant Orders   Amb Ref to Medical Weight Management      I discussed the  assessment and treatment plan with the patient. The patient was provided an opportunity to ask questions and all were answered. The patient agreed with the plan and demonstrated an understanding of the instructions.   The patient was advised to call back or seek an in-person evaluation if the symptoms worsen or if the condition fails to improve as anticipated.   I provided 15+ minutes of time during this encounter.  Follow up plan: Return in about 6 months (around 06/26/2020) for Annual physical.

## 2019-12-25 NOTE — Assessment & Plan Note (Signed)
New referral placed to weight management, as patient did not hear from anyone on first referral.

## 2019-12-25 NOTE — Patient Instructions (Signed)

## 2019-12-25 NOTE — Assessment & Plan Note (Signed)
Chronic, ongoing.  Denies SI/HI.  Reports significant improvement with occasional Buspar.  Continue current medication regimen and adjust as needed.  PHQ and GAD scores remain stable.  Return in 6 months.

## 2020-01-03 ENCOUNTER — Other Ambulatory Visit: Payer: Self-pay

## 2020-01-03 ENCOUNTER — Ambulatory Visit (INDEPENDENT_AMBULATORY_CARE_PROVIDER_SITE_OTHER): Payer: Medicaid Other | Admitting: Family Medicine

## 2020-01-03 ENCOUNTER — Encounter (INDEPENDENT_AMBULATORY_CARE_PROVIDER_SITE_OTHER): Payer: Self-pay | Admitting: Family Medicine

## 2020-01-03 VITALS — BP 142/86 | HR 83 | Temp 98.1°F | Ht 69.0 in | Wt 342.0 lb

## 2020-01-03 DIAGNOSIS — R0602 Shortness of breath: Secondary | ICD-10-CM

## 2020-01-03 DIAGNOSIS — F418 Other specified anxiety disorders: Secondary | ICD-10-CM

## 2020-01-03 DIAGNOSIS — Z9189 Other specified personal risk factors, not elsewhere classified: Secondary | ICD-10-CM | POA: Diagnosis not present

## 2020-01-03 DIAGNOSIS — Z6841 Body Mass Index (BMI) 40.0 and over, adult: Secondary | ICD-10-CM | POA: Diagnosis not present

## 2020-01-03 DIAGNOSIS — E559 Vitamin D deficiency, unspecified: Secondary | ICD-10-CM

## 2020-01-03 DIAGNOSIS — Z0289 Encounter for other administrative examinations: Secondary | ICD-10-CM

## 2020-01-03 DIAGNOSIS — R5383 Other fatigue: Secondary | ICD-10-CM | POA: Diagnosis not present

## 2020-01-03 NOTE — Progress Notes (Signed)
Dear Aura Dials, NP,   Thank you for referring Rachel Cooley to our clinic. The following note includes my evaluation and treatment recommendations.  Chief Complaint:   OBESITY Rachel Cooley (MR# [id number]) is a 39 y.o. female who presents for evaluation and treatment of obesity and related comorbidities. Current BMI is Body mass index is 50.5 kg/m.Rachel Cooley has been struggling with her weight for many years and has been unsuccessful in either losing weight, maintaining weight loss, or reaching her healthy weight goal.  Rachel Cooley is currently in the action stage of change and ready to dedicate time achieving and maintaining a healthier weight. [name] is interested in becoming our patient and working on intensive lifestyle modifications including (but not limited to) diet and exercise for weight loss.  Rachel Cooley has 53-year-old twins at home. For breakfast she has a bagel with whipped cream cheese and diet Pepsi (feels full for a little while); has a snack at 11/11:30 a.m. of yogurt, granola, and 1/4 cup of berries; for lunch she has a ham sandwich with 3 slices of ham, mustard, and 1 tsp of mayonnaise and feels satisfied); for dinner she will have a bowl of Crunchy Raisin Bran cereal (2 cups) or Cheerios with whole milk.  Rachel Cooley's habits were reviewed today and are as follows: Her family eats meals together, she thinks her family will eat healthier with her, her desired weight loss is 67 lbs, she has been heavy most of her life, she started gaining weight after quitting smoking and pregnancy, her heaviest weight ever was 354 pounds, she is somewhat of a picky eater and doesn't like to eat healthier foods, she craves sweets, she snacks sometimes in the evenings, she is frequently drinking liquids with calories, she frequently makes poor food choices, she frequently eats larger portions than normal and she struggles with emotional eating.  Depression Screen Rachel Cooley's Food and Mood (modified  PHQ-9) score was 12.  Depression screen [facility] 2/9 01/03/2020  Decreased Interest 3  Down, Depressed, Hopeless 3  PHQ - 2 Score 6  Altered sleeping 1  Tired, decreased energy 3  Change in appetite 2  Feeling bad or failure about yourself  0  Trouble concentrating 0  Moving slowly or fidgety/restless 0  Suicidal thoughts 0  PHQ-9 Score 12  Difficult doing work/chores Not difficult at all   Subjective:   Other fatigue. Nikkol denies daytime somnolence and admits to waking up still tired. Rachel Cooley generally gets 7-9 hours of sleep per night, and states that she has generally restful sleep. Snoring is most likely present. Apneic episodes are not present. Epworth Sleepiness Score is 6.  Shortness of breath on exertion. Rachel Cooley notes increasing shortness of breath with exercising and seems to be worsening over time with weight gain. She notes getting out of breath sooner with activity than she used to. This has gotten worse recently. Rachel Cooley denies shortness of breath at rest or orthopnea.  Depression with anxiety. Rachel Cooley is struggling with emotional eating and using food for comfort to the extent that it is negatively impacting her health. She has been working on behavior modification techniques to help reduce her emotional eating and has been somewhat successful. She shows no sign of suicidal or homicidal ideations. Rachel Cooley has been on Effexor since the age of 60; Wellbutrin 5-6 years ago; and started Buspar 6 months ago. She reports symptoms are well controlled.  Vitamin D deficiency. No nausea, vomiting, or muscle weakness. She endorses fatigue. Rachel Cooley is on Vitamin D  50 mcg daily.  At risk for osteoporosis. Rachel Cooley is at higher risk of osteopenia and osteoporosis due to Vitamin D deficiency.   Assessment/Plan:   Other fatigue. Rachel Cooley does feel that her weight is causing her energy to be lower than it should be. Fatigue may be related to obesity, depression or many other causes. Labs will be  ordered, and in the meanwhile, Rachel Cooley will focus on self care including making healthy food choices, increasing physical activity and focusing on stress reduction. EKG 12-Lead showed NSR at 88 BPM. Vitamin B12, CBC with Differential/Platelet, Comprehensive metabolic panel, T3, T4, free, TSH, Hemoglobin A1c, Insulin, random, Folate labs ordered today.  Shortness of breath on exertion. Rachel Cooley does feel that she gets out of breath more easily that she used to when she exercises. Rachel Cooley's shortness of breath appears to be obesity related and exercise induced. She has agreed to work on weight loss and gradually increase exercise to treat her exercise induced shortness of breath. Will continue to monitor closely. Lipid Panel With LDL/HDL Ratio labs ordered.  Depression with anxiety. Behavior modification techniques were discussed today to help Rachel Cooley deal with her emotional/non-hunger eating behaviors.  Orders and follow up as documented in patient record. Will follow-up at her next appointment.  Vitamin D deficiency. Low Vitamin D level contributes to fatigue and are associated with obesity, breast, and colon cancer. VITAMIN D 25 Hydroxy (Vit-D Deficiency, Fractures) level ordered today.  At risk for osteoporosis. Rachel Cooley was given approximately 15 minutes of osteoporosis prevention counseling today. Rachel Cooley is at risk for osteopenia and osteoporosis due to her Vitamin D deficiency. She was encouraged to take her Vitamin D and follow her higher calcium diet and increase strengthening exercise to help strengthen her bones and decrease her risk of osteopenia and osteoporosis.  Repetitive spaced learning was employed today to elicit superior memory formation and behavioral change.  Class 3 severe obesity with serious comorbidity and body mass index (BMI) of 60.0 to 69.9 in adult, unspecified obesity type (HCC).  Rachel Cooley is currently in the action stage of change and her goal is to continue with weight loss  efforts. I recommend Rachel Cooley begin the structured treatment plan as follows:  She has agreed to the Category 4 Plan.  Exercise goals: No exercise has been prescribed at this time.   Behavioral modification strategies: increasing lean protein intake, increasing vegetables, meal planning and cooking strategies, keeping healthy foods in the home, better snacking choices and planning for success.  She was informed of the importance of frequent follow-up visits to maximize her success with intensive lifestyle modifications for her multiple health conditions. She was informed we would discuss her lab results at her next visit unless there is a critical issue that needs to be addressed sooner. Lori agreed to keep her next visit at the agreed upon time to discuss these results.  Objective:   Blood pressure (!) 142/86, pulse 83, temperature 98.1 F (36.7 C), temperature source Oral, height 5\' 9"  (1.753 m), weight (!) 342 lb (155.1 kg), last menstrual period 12/08/2019, SpO2 97 %, unknown if currently breastfeeding. Body mass index is 50.5 kg/m.  EKG: Sinus  Rhythm with a rate of 88 BPM. 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    Indirect Calorimeter completed today shows a VO2 of 481 and a REE of 3353.  Her calculated basal metabolic rate is 02/07/2020 thus her basal metabolic rate is better than expected.  General: Cooperative, alert, well developed, in no acute distress. HEENT: Conjunctivae and lids unremarkable. Cardiovascular:  Regular rhythm.  Lungs: Normal work of breathing. Neurologic: No focal deficits.   Lab Results  Component Value Date   CREATININE 0.68 06/23/2019   BUN 21 (H) 06/23/2019   NA 138 06/23/2019   K 4.6 06/23/2019   CL 102 06/23/2019   CO2 22 06/23/2019   Lab Results  Component Value Date   ALT 31 06/23/2019   AST 23 06/23/2019   ALKPHOS 68 06/23/2019   BILITOT 0.3 06/23/2019   Lab Results  Component Value Date   HGBA1C 5.2 07/26/2017   No results found for: INSULIN Lab  Results  Component Value Date   TSH 2.270 06/23/2019   Lab Results  Component Value Date   CHOL 237 (H) 06/23/2019   HDL 46 06/23/2019   LDLCALC 161 (H) 06/23/2019   TRIG 167 (H) 06/23/2019   Lab Results  Component Value Date   WBC 7.9 06/23/2019   HGB 11.8 06/23/2019   HCT 36.6 06/23/2019   MCV 84 06/23/2019   PLT 249 06/23/2019   No results found for: IRON, TIBC, FERRITIN  Attestation Statements:   This is the patient's first visit at [facility]. The patient's NEW PATIENT PACKET was reviewed at length. Included in the packet: current and past health history, medications, allergies, ROS, gynecologic history (women only), surgical history, family history, social history, weight history, weight loss surgery history (for those that have had weight loss surgery), nutritional evaluation, mood and food questionnaire, PHQ9, Epworth questionnaire, sleep habits questionnaire, patient life and health improvement goals questionnaire. These will all be scanned into the patient's chart under media.   During the visit, I independently reviewed the patient's EKG, bioimpedance scale results, and indirect calorimeter results. I used this information to tailor a meal plan for the patient that will help her to lose weight and will improve her obesity-related conditions going forward. I performed a medically necessary appropriate examination and/or evaluation. I discussed the assessment and treatment plan with the patient. The patient was provided an opportunity to ask questions and all were answered. The patient agreed with the plan and demonstrated an understanding of the instructions. Labs were ordered at this visit and will be reviewed at the next visit unless more critical results need to be addressed immediately. Clinical information was updated and documented in the EMR.   Time spent on visit including pre-visit chart review and post-visit care was 45 minutes.   A separate 15  minutes was spent on risk counseling (see above).    I, Michaelene Song, am acting as transcriptionist for Coralie Common, MD   I have reviewed the above documentation for accuracy and completeness, and I agree with the above. - Ilene Qua, MD

## 2020-01-12 LAB — VITAMIN D 25 HYDROXY (VIT D DEFICIENCY, FRACTURES)

## 2020-01-12 LAB — HEMOGLOBIN A1C
Est. average glucose Bld gHb Est-mCnc: 108 mg/dL
Hgb A1c MFr Bld: 5.4 % (ref 4.8–5.6)

## 2020-01-12 LAB — CBC WITH DIFFERENTIAL/PLATELET
Basophils Absolute: 0.1 10*3/uL (ref 0.0–0.2)
Basos: 1 %
EOS (ABSOLUTE): 0.2 10*3/uL (ref 0.0–0.4)
Eos: 2 %
Hematocrit: 35.7 % (ref 34.0–46.6)
Hemoglobin: 11.8 g/dL (ref 11.1–15.9)
Immature Grans (Abs): 0 10*3/uL (ref 0.0–0.1)
Immature Granulocytes: 0 %
Lymphocytes Absolute: 2.3 10*3/uL (ref 0.7–3.1)
Lymphs: 27 %
MCH: 26.3 pg — ABNORMAL LOW (ref 26.6–33.0)
MCHC: 33.1 g/dL (ref 31.5–35.7)
MCV: 80 fL (ref 79–97)
Monocytes Absolute: 0.5 10*3/uL (ref 0.1–0.9)
Monocytes: 6 %
Neutrophils Absolute: 5.5 10*3/uL (ref 1.4–7.0)
Neutrophils: 64 %
Platelets: 322 10*3/uL (ref 150–450)
RBC: 4.48 x10E6/uL (ref 3.77–5.28)
RDW: 15.1 % (ref 11.7–15.4)
WBC: 8.5 10*3/uL (ref 3.4–10.8)

## 2020-01-12 LAB — LIPID PANEL WITH LDL/HDL RATIO

## 2020-01-12 LAB — COMPREHENSIVE METABOLIC PANEL

## 2020-01-12 LAB — INSULIN, RANDOM

## 2020-01-12 LAB — FOLATE

## 2020-01-12 LAB — VITAMIN B12

## 2020-01-12 LAB — T4, FREE

## 2020-01-12 LAB — TSH

## 2020-01-12 LAB — T3

## 2020-01-16 ENCOUNTER — Other Ambulatory Visit (INDEPENDENT_AMBULATORY_CARE_PROVIDER_SITE_OTHER): Payer: Self-pay

## 2020-01-16 DIAGNOSIS — R5383 Other fatigue: Secondary | ICD-10-CM

## 2020-01-16 DIAGNOSIS — R0602 Shortness of breath: Secondary | ICD-10-CM | POA: Diagnosis not present

## 2020-01-16 DIAGNOSIS — E559 Vitamin D deficiency, unspecified: Secondary | ICD-10-CM | POA: Diagnosis not present

## 2020-01-17 ENCOUNTER — Other Ambulatory Visit: Payer: Self-pay

## 2020-01-17 ENCOUNTER — Ambulatory Visit (INDEPENDENT_AMBULATORY_CARE_PROVIDER_SITE_OTHER): Payer: Medicaid Other | Admitting: Family Medicine

## 2020-01-17 ENCOUNTER — Encounter (INDEPENDENT_AMBULATORY_CARE_PROVIDER_SITE_OTHER): Payer: Self-pay | Admitting: Family Medicine

## 2020-01-17 VITALS — BP 150/85 | HR 83 | Temp 97.8°F | Ht 69.0 in | Wt 344.0 lb

## 2020-01-17 DIAGNOSIS — E8881 Metabolic syndrome: Secondary | ICD-10-CM

## 2020-01-17 DIAGNOSIS — Z6841 Body Mass Index (BMI) 40.0 and over, adult: Secondary | ICD-10-CM

## 2020-01-17 DIAGNOSIS — Z9189 Other specified personal risk factors, not elsewhere classified: Secondary | ICD-10-CM

## 2020-01-17 DIAGNOSIS — R030 Elevated blood-pressure reading, without diagnosis of hypertension: Secondary | ICD-10-CM | POA: Diagnosis not present

## 2020-01-17 DIAGNOSIS — E559 Vitamin D deficiency, unspecified: Secondary | ICD-10-CM | POA: Diagnosis not present

## 2020-01-17 DIAGNOSIS — E7849 Other hyperlipidemia: Secondary | ICD-10-CM

## 2020-01-17 LAB — COMPREHENSIVE METABOLIC PANEL
ALT: 16 IU/L (ref 0–32)
AST: 16 IU/L (ref 0–40)
Albumin/Globulin Ratio: 2 (ref 1.2–2.2)
Albumin: 4.3 g/dL (ref 3.8–4.8)
Alkaline Phosphatase: 79 IU/L (ref 39–117)
BUN/Creatinine Ratio: 26 — ABNORMAL HIGH (ref 9–23)
BUN: 16 mg/dL (ref 6–20)
Bilirubin Total: 0.3 mg/dL (ref 0.0–1.2)
CO2: 21 mmol/L (ref 20–29)
Calcium: 9 mg/dL (ref 8.7–10.2)
Chloride: 100 mmol/L (ref 96–106)
Creatinine, Ser: 0.62 mg/dL (ref 0.57–1.00)
GFR calc Af Amer: 133 mL/min/{1.73_m2} (ref >59)
GFR calc non Af Amer: 116 mL/min/{1.73_m2} (ref >59)
Globulin, Total: 2.2 g/dL (ref 1.5–4.5)
Glucose: 76 mg/dL (ref 65–99)
Potassium: 4.2 mmol/L (ref 3.5–5.2)
Sodium: 137 mmol/L (ref 134–144)
Total Protein: 6.5 g/dL (ref 6.0–8.5)

## 2020-01-17 LAB — TSH: TSH: 2 u[IU]/mL (ref 0.450–4.500)

## 2020-01-17 LAB — LIPID PANEL WITH LDL/HDL RATIO
Cholesterol, Total: 226 mg/dL — ABNORMAL HIGH (ref 100–199)
HDL: 48 mg/dL (ref >39)
LDL Chol Calc (NIH): 150 mg/dL — ABNORMAL HIGH (ref 0–99)
LDL/HDL Ratio: 3.1 ratio (ref 0.0–3.2)
Triglycerides: 153 mg/dL — ABNORMAL HIGH (ref 0–149)
VLDL Cholesterol Cal: 28 mg/dL (ref 5–40)

## 2020-01-17 LAB — T3: T3, Total: 134 ng/dL (ref 71–180)

## 2020-01-17 LAB — INSULIN, RANDOM: INSULIN: 13.8 u[IU]/mL (ref 2.6–24.9)

## 2020-01-17 LAB — T4, FREE: Free T4: 0.96 ng/dL (ref 0.82–1.77)

## 2020-01-17 LAB — VITAMIN D 25 HYDROXY (VIT D DEFICIENCY, FRACTURES): Vit D, 25-Hydroxy: 30.2 ng/mL (ref 30.0–100.0)

## 2020-01-17 LAB — VITAMIN B12: Vitamin B-12: 520 pg/mL (ref 232–1245)

## 2020-01-17 LAB — FOLATE: Folate: 15.8 ng/mL (ref >3.0)

## 2020-01-17 MED ORDER — VITAMIN D (ERGOCALCIFEROL) 1.25 MG (50000 UNIT) PO CAPS: 50000.0000 [IU] | ORAL_CAPSULE | ORAL | 0 refills | Status: DC

## 2020-01-18 NOTE — Progress Notes (Signed)
Chief Complaint:   OBESITY Rachel Cooley is here to discuss her progress with her obesity treatment plan along with follow-up of her obesity related diagnoses. Rachel Cooley is on the Category 4 Plan and states she is following her eating plan approximately 90% of the time. Rachel Cooley states she is walking 1 mile 3-4 times per week.  Today's visit was #: 2 Starting weight: 342 lbs Starting date: 01/03/2020 Today's weight: 344 lbs Today's date: 01/17/2020 Total lbs lost to date: 0 Total lbs lost since last in-office visit: 0  Interim History: Rachel Cooley downloaded the Lose It app on her phone.  For breakfast, she is doing 3 boiled eggs and 2 butterball [place] sausage, blueberry bagel (280), Diet Pepsi.  Snack is blueberry yogurt.  For lunch she will have a sandwich (3 ounces of [place]) with cheese, mustard, simply white cheddar Cheetos (160 calories).  Dinner consists of steak and broccoli.  She is getting in 2268 calories.  She is not sure of the protein amount.  She had 5 pounds of water weight gain.  Subjective:   1. Other hyperlipidemia Telissa has hyperlipidemia and has been trying to improve her cholesterol levels with intensive lifestyle modification including a low saturated fat diet, exercise and weight loss. She denies any chest pain, claudication or myalgias.  Could not put in to calculate due to age (only 73).  Lab Results  Component Value Date   ALT 16 01/16/2020   AST 16 01/16/2020   ALKPHOS 79 01/16/2020   BILITOT 0.3 01/16/2020   Lab Results  Component Value Date   CHOL 226 (H) 01/16/2020   HDL 48 01/16/2020   LDLCALC 150 (H) 01/16/2020   TRIG 153 (H) 01/16/2020   2. Vitamin D deficiency Rachel Cooley's Vitamin D level was 30.2 on 01/16/2020. She is currently taking OTC vitamin D 2000 IU each day. She denies nausea, vomiting or muscle weakness.  She endorses fatigue.  3. Insulin resistance Rachel Cooley has a diagnosis of insulin resistance based on her elevated fasting insulin level >5. She  continues to work on diet and exercise to decrease her risk of diabetes.  She is not on any medication.  She endorses carb cravings for starches.  Lab Results  Component Value Date   INSULIN 13.8 01/16/2020   INSULIN CANCELED 01/03/2020   Lab Results  Component Value Date   HGBA1C 5.4 01/03/2020   4. Elevated blood pressure reading No chest pain, chest pressure, or headache.  Blood pressure was elevated at last appointment as well.  Manual BP 140/80.  She is not on blood pressure medication, but is on Wellbutrin and Effexor.  BP Readings from Last 3 Encounters:  01/17/20 (!) 150/85  01/03/20 (!) 142/86  06/23/19 115/80   5. At risk for diabetes mellitus Rachel Cooley is at higher than average risk for developing diabetes due to her obesity.   Assessment/Plan:   1. Other hyperlipidemia Cardiovascular risk and specific lipid/LDL goals reviewed.  We discussed several lifestyle modifications today and Gauri will continue to work on diet, exercise and weight loss efforts. Orders and follow up as documented in patient record.  Will recheck FLP in 3 months.  Counseling Intensive lifestyle modifications are the first line treatment for this issue. . Dietary changes: Increase soluble fiber. Decrease simple carbohydrates. . Exercise changes: Moderate to vigorous-intensity aerobic activity 150 minutes per week if tolerated. . Lipid-lowering medications: see documented in medical record.  2. Vitamin D deficiency Low Vitamin D level contributes to fatigue and are associated with  obesity, breast, and colon cancer. She agrees to stop OTC vitamin D and start prescription Vitamin D @50 ,000 IU every week and will follow-up for routine testing of Vitamin D, at least 2-3 times per year to avoid over-replacement. - Vitamin D, Ergocalciferol, (DRISDOL) 1.25 MG (50000 UNIT) CAPS capsule; Take 1 capsule (50,000 Units total) by mouth every 7 (seven) days.  Dispense: 4 capsule; Refill: 0  3. Insulin  resistance Rachel Cooley will continue to work on weight loss, exercise, and decreasing simple carbohydrates to help decrease the risk of diabetes. Rachel Cooley agreed to follow-up with Nehemiah Settle as directed to closely monitor her progress.  Will recheck A1c and insulin in 3 months.  4. Elevated blood pressure reading Follow-up at next appointment to check if blood pressure is elevated again.  5. At risk for diabetes mellitus Rachel Cooley was given approximately 15 minutes of diabetes education and counseling today. We discussed intensive lifestyle modifications today with an emphasis on weight loss as well as increasing exercise and decreasing simple carbohydrates in her diet. We also reviewed medication options with an emphasis on risk versus benefit of those discussed.   Repetitive spaced learning was employed today to elicit superior memory formation and behavioral change.  6. Class 3 severe obesity with serious comorbidity and body mass index (BMI) of 50.0 to 59.9 in adult, unspecified obesity type (HCC) Rachel Cooley is currently in the action stage of change. As such, her goal is to continue with weight loss efforts. She has agreed to the Category 4 Plan.   Exercise goals: As is.  Behavioral modification strategies: increasing lean protein intake, decreasing simple carbohydrates, better snacking choices and planning for success.  Rachel Cooley has agreed to follow-up with our clinic in 2 weeks. She was informed of the importance of frequent follow-up visits to maximize her success with intensive lifestyle modifications for her multiple health conditions.   Objective:   Blood pressure (!) 150/85, pulse 83, temperature 97.8 F (36.6 C), temperature source Oral, height 5\' 9"  (1.753 m), weight (!) 344 lb (156 kg), last menstrual period 01/02/2020, SpO2 99 %, unknown if currently breastfeeding. Body mass index is 50.8 kg/m.  General: Cooperative, alert, well developed, in no acute distress. HEENT: Conjunctivae and lids  unremarkable. Cardiovascular: Regular rhythm.  Lungs: Normal work of breathing. Neurologic: No focal deficits.   Lab Results  Component Value Date   CREATININE 0.62 01/16/2020   BUN 16 01/16/2020   NA 137 01/16/2020   K 4.2 01/16/2020   CL 100 01/16/2020   CO2 21 01/16/2020   Lab Results  Component Value Date   ALT 16 01/16/2020   AST 16 01/16/2020   ALKPHOS 79 01/16/2020   BILITOT 0.3 01/16/2020   Lab Results  Component Value Date   HGBA1C 5.4 01/03/2020   HGBA1C 5.2 07/26/2017   Lab Results  Component Value Date   INSULIN 13.8 01/16/2020   INSULIN CANCELED 01/03/2020   Lab Results  Component Value Date   TSH 2.000 01/16/2020   Lab Results  Component Value Date   CHOL 226 (H) 01/16/2020   HDL 48 01/16/2020   LDLCALC 150 (H) 01/16/2020   TRIG 153 (H) 01/16/2020   Lab Results  Component Value Date   WBC 8.5 01/03/2020   HGB 11.8 01/03/2020   HCT 35.7 01/03/2020   MCV 80 01/03/2020   PLT 322 01/03/2020   Attestation Statements:   Reviewed by clinician on day of visit: allergies, medications, problem list, medical history, surgical history, family history, social history, and previous  encounter notes.  I, Water quality scientist, CMA, am acting as transcriptionist for Coralie Common, MD.  I have reviewed the above documentation for accuracy and completeness, and I agree with the above. - Ilene Qua, MD

## 2020-01-23 ENCOUNTER — Encounter (INDEPENDENT_AMBULATORY_CARE_PROVIDER_SITE_OTHER): Payer: Self-pay | Admitting: Family Medicine

## 2020-01-23 NOTE — Telephone Encounter (Signed)
Please advise 

## 2020-02-06 ENCOUNTER — Ambulatory Visit (INDEPENDENT_AMBULATORY_CARE_PROVIDER_SITE_OTHER): Payer: Medicaid Other | Admitting: Family Medicine

## 2020-02-08 ENCOUNTER — Ambulatory Visit (INDEPENDENT_AMBULATORY_CARE_PROVIDER_SITE_OTHER): Payer: Medicaid Other | Admitting: Family Medicine

## 2020-02-08 ENCOUNTER — Other Ambulatory Visit: Payer: Self-pay

## 2020-02-08 ENCOUNTER — Encounter (INDEPENDENT_AMBULATORY_CARE_PROVIDER_SITE_OTHER): Payer: Self-pay | Admitting: Family Medicine

## 2020-02-08 VITALS — BP 129/82 | HR 91 | Temp 97.8°F | Ht 69.0 in | Wt 340.0 lb

## 2020-02-08 DIAGNOSIS — E8881 Metabolic syndrome: Secondary | ICD-10-CM

## 2020-02-08 DIAGNOSIS — Z6841 Body Mass Index (BMI) 40.0 and over, adult: Secondary | ICD-10-CM

## 2020-02-08 DIAGNOSIS — E559 Vitamin D deficiency, unspecified: Secondary | ICD-10-CM

## 2020-02-08 MED ORDER — VITAMIN D (ERGOCALCIFEROL) 1.25 MG (50000 UNIT) PO CAPS: 50000.0000 [IU] | ORAL_CAPSULE | ORAL | 0 refills | Status: DC

## 2020-02-10 ENCOUNTER — Other Ambulatory Visit (INDEPENDENT_AMBULATORY_CARE_PROVIDER_SITE_OTHER): Payer: Self-pay | Admitting: Family Medicine

## 2020-02-10 DIAGNOSIS — E559 Vitamin D deficiency, unspecified: Secondary | ICD-10-CM

## 2020-02-12 NOTE — Progress Notes (Signed)
Chief Complaint:   Rachel Cooley is here to discuss her progress with her Rachel treatment plan along with follow-up of her Rachel related diagnoses. Reid is on the Category 4 Plan and states she is following her eating plan approximately 90% of the time. Janasha states she is walks 3 miles 5-6 times per week.  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 For breakfast she reports having egg and [place] sausage with a Marcello Moores' blueberry bagel; Lunch is a salad with low sugar craisins, [place] ranch dressing, and 3 oz of chicken; Dinner is chicken salad. She reports logging her food and getting 1900 calories and ~130 grams of protein daily.  Subjective:   Vitamin D deficiency. No nausea, vomiting, or muscle weakness. Fronnie endorses fatigue. She is on prescription Vitamin D. Last Vitamin D 30.2 on 01/16/2020.  Insulin resistance. Yamaris has a diagnosis of insulin resistance based on her elevated fasting insulin level >5. She continues to work on diet and exercise to decrease her risk of diabetes. She endorses minimal cravings; denies hunger.  Lab Results  Component Value Date   INSULIN 13.8 01/16/2020   INSULIN CANCELED 01/03/2020   Lab Results  Component Value Date   HGBA1C 5.4 01/03/2020   Assessment/Plan:   Vitamin D deficiency. Low Vitamin D level contributes to fatigue and are associated with Rachel, breast, and colon cancer. She was given a refill on her Vitamin D, Ergocalciferol, (DRISDOL) 1.25 MG (50000 UNIT) CAPS capsule every week #4 with 0 refills and will follow-up for routine testing of Vitamin D, at least 2-3 times per year to avoid over-replacement.    Insulin resistance.  [name] will continue to work on weight loss, exercise, and decreasing simple carbohydrates to help decrease the risk of diabetes. [name] agreed to follow-up with [place] as directed to closely monitor her progress. Will repeat labs in 2.5 months.  Class 3 severe Rachel with serious comorbidity and body mass index (BMI) of 50.0 to 59.9 in adult, unspecified Rachel type ([place]).  [name] is currently in the action stage of change. As such, her goal is to continue with weight loss efforts. She has agreed to the Category 4 Plan.   Exercise goals: [name] will continue her current exercise regimen.  Behavioral modification strategies: increasing lean protein intake, increasing vegetables, meal planning and cooking strategies, keeping healthy foods in the home and planning for success.  [name] has agreed to follow-up with our clinic in 2 weeks. She was informed of the importance of frequent follow-up visits to maximize her success with intensive lifestyle modifications for her multiple health conditions.   Objective:   Blood pressure 129/82, pulse 91, temperature 97.8 F (36.6 C), temperature source Oral, height 5\' 9"  (1.753 m), weight (!) 340 lb (154.2 kg), last menstrual period 01/28/2020, SpO2 99 %, unknown if currently breastfeeding. Body mass index is 50.21 kg/m.  General: Cooperative, alert, well developed, in no acute distress. HEENT: Conjunctivae and lids unremarkable. Cardiovascular: Regular rhythm.  Lungs: Normal work of breathing. Neurologic: No focal deficits.   Lab Results  Component Value Date   CREATININE 0.62 01/16/2020   BUN 16 01/16/2020   NA 137 01/16/2020   K 4.2 01/16/2020   CL  100 01/16/2020   CO2 21 01/16/2020   Lab Results  Component Value Date   ALT 16 01/16/2020   AST 16 01/16/2020   ALKPHOS 79 01/16/2020   BILITOT 0.3 01/16/2020   Lab Results  Component Value Date   HGBA1C 5.4 01/03/2020   HGBA1C 5.2 07/26/2017   Lab Results  Component Value Date    INSULIN 13.8 01/16/2020   INSULIN CANCELED 01/03/2020   Lab Results  Component Value Date   TSH 2.000 01/16/2020   Lab Results  Component Value Date   CHOL 226 (H) 01/16/2020   HDL 48 01/16/2020   LDLCALC 150 (H) 01/16/2020   TRIG 153 (H) 01/16/2020   Lab Results  Component Value Date   WBC 8.5 01/03/2020   HGB 11.8 01/03/2020   HCT 35.7 01/03/2020   MCV 80 01/03/2020   PLT 322 01/03/2020   No results found for: IRON, TIBC, FERRITIN  Attestation Statements:   Reviewed by clinician on day of visit: allergies, medications, problem list, medical history, surgical history, family history, social history, and previous encounter notes.  Time spent on visit including pre-visit chart review and post-visit charting and care was 25 minutes.   I, Marianna Payment, am acting as transcriptionist for Rachel Likes, MD   I have reviewed the above documentation for accuracy and completeness, and I agree with the above. - Katherina Mires, MD

## 2020-02-29 ENCOUNTER — Encounter (INDEPENDENT_AMBULATORY_CARE_PROVIDER_SITE_OTHER): Payer: Self-pay | Admitting: Family Medicine

## 2020-02-29 ENCOUNTER — Ambulatory Visit (INDEPENDENT_AMBULATORY_CARE_PROVIDER_SITE_OTHER): Payer: Medicaid Other | Admitting: Family Medicine

## 2020-02-29 ENCOUNTER — Other Ambulatory Visit: Payer: Self-pay

## 2020-02-29 VITALS — BP 131/82 | HR 90 | Temp 97.9°F | Ht 69.0 in | Wt 339.0 lb

## 2020-02-29 DIAGNOSIS — E8881 Metabolic syndrome: Secondary | ICD-10-CM | POA: Diagnosis not present

## 2020-02-29 DIAGNOSIS — E7849 Other hyperlipidemia: Secondary | ICD-10-CM

## 2020-02-29 DIAGNOSIS — Z6841 Body Mass Index (BMI) 40.0 and over, adult: Secondary | ICD-10-CM | POA: Diagnosis not present

## 2020-02-29 MED ORDER — METFORMIN HCL 500 MG PO TABS: 500.0000 mg | ORAL_TABLET | Freq: Every day | ORAL | 0 refills | Status: DC

## 2020-03-05 ENCOUNTER — Other Ambulatory Visit (INDEPENDENT_AMBULATORY_CARE_PROVIDER_SITE_OTHER): Payer: Self-pay | Admitting: Family Medicine

## 2020-03-05 DIAGNOSIS — E559 Vitamin D deficiency, unspecified: Secondary | ICD-10-CM

## 2020-03-05 NOTE — Progress Notes (Signed)
Chief Complaint:   OBESITY Rachel Cooley is here to discuss her progress with her obesity treatment plan along with follow-up of her obesity related diagnoses. [name] is on the Category 4 Plan and states she is following her eating plan approximately 90% of the time. [name] states she is walking and 3 miles of steps 5 times per week.  Today's visit was #: 4 Starting weight: 342 lbs Starting date: 01/03/2020 Today's weight: 339 lbs Today's date: 02/29/2020 Total lbs lost to date: 3 Total lbs lost since last in-office visit: 1  Interim History: Rachel Cooley followed the plan ~90% (Monday through Friday). Weekends are difficult secondary to eating out at breakfast or dinner. She documents what she eats when eating out. She reports feeling hungry ~3-4 hours after a meal. She tops off protein at dinner. Calories are averaging ~2000 with protein averaging 130-150 grams a day.  Subjective:   Insulin resistance. [name] has a diagnosis of insulin resistance based on her elevated fasting insulin level >5. She continues to work on diet and exercise to decrease her risk of diabetes. She is on no medication. She has a Paragard IUD.  Lab Results  Component Value Date   INSULIN 13.8 01/16/2020   INSULIN CANCELED 01/03/2020   Lab Results  Component Value Date   HGBA1C 5.4 01/03/2020   Other hyperlipidemia. Rachel Cooley has hyperlipidemia and has been trying to improve her cholesterol levels with intensive lifestyle modification including a low saturated fat diet, exercise and weight loss. She denies any chest pain, claudication or myalgias. She is not on a statin.  Lab Results  Component Value Date   ALT 16 01/16/2020   AST 16 01/16/2020   ALKPHOS 79 01/16/2020   BILITOT 0.3 01/16/2020   Lab Results  Component Value Date   CHOL 226 (H) 01/16/2020   HDL 48 01/16/2020   LDLCALC 150 (H) 01/16/2020   TRIG 153 (H) 01/16/2020   Assessment/Plan:   Insulin resistance. [name] will continue to  work on weight loss, exercise, and decreasing simple carbohydrates to help decrease the risk of diabetes. [name] agreed to follow-up with [place] as directed to closely monitor her progress. [name] will start metFORMIN (GLUCOPHAGE) 500 MG tablet PO QAM #30 with 0 refills.  Other hyperlipidemia. Cardiovascular risk and specific lipid/LDL goals reviewed.  We discussed several lifestyle modifications today and Rachel Cooley will continue to work on diet, exercise and weight loss efforts. Orders and follow up as documented in patient record. Rachel Cooley will have follow-up labs in late July/early August.  Counseling Intensive lifestyle modifications are the first line treatment for this issue. . Dietary changes: Increase soluble fiber. Decrease simple carbohydrates. . Exercise changes: Moderate to vigorous-intensity aerobic activity 150 minutes per week if tolerated. . Lipid-lowering medications: see documented in medical record.  Class 3 severe obesity with serious comorbidity and body mass index (BMI) of 50.0 to 59.9 in adult, unspecified obesity type ([place]).  [name] is currently in the action stage of change. As such, her goal is to continue with weight loss efforts. She has agreed to the Category 4 Plan.   Exercise goals: For substantial health benefits, adults should do at least 150 minutes (2 hours and 30 minutes) a week of moderate-intensity, or 75 minutes (1 hour and 15 minutes) a week of vigorous-intensity aerobic physical activity, or an equivalent combination of moderate- and vigorous-intensity aerobic activity. Aerobic activity should be performed in episodes of at least 10 minutes, and preferably, it should be spread throughout the week.  Behavioral modification strategies: increasing lean protein intake, meal planning and cooking strategies, keeping healthy foods in the home and keeping a strict food journal.  Rachel Cooley has agreed to follow-up with our clinic in 2 weeks. She was informed of the importance of  frequent follow-up visits to maximize her success with intensive lifestyle modifications for her multiple health conditions.   Objective:   Blood pressure 131/82, pulse 90, temperature 97.9 F (36.6 C), temperature source Oral, height 5\' 9"  (1.753 m), weight (!) 339 lb (153.8 kg), last menstrual period 02/20/2020, SpO2 96 %, unknown if currently breastfeeding. Body mass index is 50.06 kg/m.  General: Cooperative, alert, well developed, in no acute distress. HEENT: Conjunctivae and lids unremarkable. Cardiovascular: Regular rhythm.  Lungs: Normal work of breathing. Neurologic: No focal deficits.   Lab Results  Component Value Date   CREATININE 0.62 01/16/2020   BUN 16 01/16/2020   NA 137 01/16/2020   K 4.2 01/16/2020   CL 100 01/16/2020   CO2 21 01/16/2020   Lab Results  Component Value Date   ALT 16 01/16/2020   AST 16 01/16/2020   ALKPHOS 79 01/16/2020   BILITOT 0.3 01/16/2020   Lab Results  Component Value Date   HGBA1C 5.4 01/03/2020   HGBA1C 5.2 07/26/2017   Lab Results  Component Value Date   INSULIN 13.8 01/16/2020   INSULIN CANCELED 01/03/2020   Lab Results  Component Value Date   TSH 2.000 01/16/2020   Lab Results  Component Value Date   CHOL 226 (H) 01/16/2020   HDL 48 01/16/2020   LDLCALC 150 (H) 01/16/2020   TRIG 153 (H) 01/16/2020   Lab Results  Component Value Date   WBC 8.5 01/03/2020   HGB 11.8 01/03/2020   HCT 35.7 01/03/2020   MCV 80 01/03/2020   PLT 322 01/03/2020   No results found for: IRON, TIBC, FERRITIN  Attestation Statements:   Reviewed by clinician on day of visit: allergies, medications, problem list, medical history, surgical history, family history, social history, and previous encounter notes.  Time spent on visit including pre-visit chart review and post-visit charting and care was 25 minutes.   I, 01/05/2020, am acting as transcriptionist for Marianna Payment, MD   I have reviewed the above documentation for  accuracy and completeness, and I agree with the above. - Reuben Likes, MD

## 2020-03-14 ENCOUNTER — Other Ambulatory Visit: Payer: Self-pay

## 2020-03-14 ENCOUNTER — Ambulatory Visit (INDEPENDENT_AMBULATORY_CARE_PROVIDER_SITE_OTHER): Payer: Medicaid Other | Admitting: Family Medicine

## 2020-03-14 ENCOUNTER — Encounter (INDEPENDENT_AMBULATORY_CARE_PROVIDER_SITE_OTHER): Payer: Self-pay | Admitting: Family Medicine

## 2020-03-14 VITALS — BP 124/79 | HR 81 | Temp 98.0°F | Ht 69.0 in | Wt 336.0 lb

## 2020-03-14 DIAGNOSIS — E559 Vitamin D deficiency, unspecified: Secondary | ICD-10-CM

## 2020-03-14 DIAGNOSIS — Z6841 Body Mass Index (BMI) 40.0 and over, adult: Secondary | ICD-10-CM | POA: Diagnosis not present

## 2020-03-14 DIAGNOSIS — E8881 Metabolic syndrome: Secondary | ICD-10-CM

## 2020-03-14 MED ORDER — VITAMIN D (ERGOCALCIFEROL) 1.25 MG (50000 UNIT) PO CAPS: 50000.0000 [IU] | ORAL_CAPSULE | ORAL | 0 refills | Status: DC

## 2020-03-18 NOTE — Progress Notes (Signed)
Chief Complaint:   OBESITY Rachel Cooley is here to discuss her progress with her obesity treatment plan along with follow-up of her obesity related diagnoses. Rachel Cooley is on the Category 4 Plan and states she is following her eating plan approximately 90% of the time. Rachel Cooley states she is walking 3 miles 45 times per week.  Today's visit was #: 5 Starting weight: 342 lbs Starting date: 01/03/2020 Today's weight: 336 lbs Today's date: 03/14/2020 Total lbs lost to date: 6 Total lbs lost since last in-office visit: 3  Interim History: Rachel Cooley has been increasing her protein (doing 3 sausage patties instead of 2 at breakfast). For lunch she has 5-7 oz of chicken breast, a snack in the afternoon, and for dinner has a sandwich or salad with protein of some sort.  Subjective:   Insulin resistance. Rachel Cooley has a diagnosis of insulin resistance based on her elevated fasting insulin level >5. She continues to work on diet and exercise to decrease her risk of diabetes. Rachel Cooley is on metformin with no GI side effects. She reports improved carb cravings.  Lab Results  Component Value Date   INSULIN 13.8 01/16/2020   INSULIN CANCELED 01/03/2020   Lab Results  Component Value Date   HGBA1C 5.4 01/03/2020   Vitamin D deficiency. No nausea, vomiting, or muscle weakness, but she endorses fatigue. Last Vitamin D was 30.2 on 01/16/2020.  Assessment/Plan:   Insulin resistance. Rachel Cooley will continue to work on weight loss, exercise, and decreasing simple carbohydrates to help decrease the risk of diabetes. Rachel Cooley agreed to follow-up with [place] as directed to closely monitor her progress. She will continue metformin as directed (no refill needed).  Vitamin D deficiency. Low Vitamin D level contributes to fatigue and are associated with obesity, breast, and colon cancer. She was given a refill on her Vitamin D, Ergocalciferol, (DRISDOL) 1.25 MG (50000 UNIT) CAPS capsule every week #4 with 0 refills and  will follow-up for routine testing of Vitamin D, at least 2-3 times per year to avoid over-replacement.   Class 3 severe obesity with serious comorbidity and body mass index (BMI) of 45.0 to 49.9 in adult, unspecified obesity type (Rachel Cooley).  Rachel Cooley is currently in the action stage of change. As such, her goal is to continue with weight loss efforts. She has agreed to the Category 4 Plan.   Exercise goals: Rachel Cooley will continue her current exercise regimen.  Behavioral modification strategies: increasing lean protein intake, meal planning and cooking strategies, keeping healthy foods in the home and planning for success.  Rachel Cooley has agreed to follow-up with our clinic in 2-3 weeks. She was informed of the importance of frequent follow-up visits to maximize her success with intensive lifestyle modifications for her multiple health conditions.   Objective:   Blood pressure 124/79, pulse 81, temperature 98 F (36.7 C), temperature source Oral, height 5\' 9"  (1.753 m), weight (!) 336 lb (152.4 kg), last menstrual period 02/20/2020, unknown if currently breastfeeding. Body mass index is 49.62 kg/m.  General: Cooperative, alert, well developed, in no acute distress. HEENT: Conjunctivae and lids unremarkable. Cardiovascular: Regular rhythm.  Lungs: Normal work of breathing. Neurologic: No focal deficits.   Lab Results  Component Value Date   CREATININE 0.62 01/16/2020   BUN 16 01/16/2020   NA 137 01/16/2020   K 4.2 01/16/2020   CL 100 01/16/2020   CO2 21 01/16/2020   Lab Results  Component Value Date   ALT 16 01/16/2020   AST 16 01/16/2020  ALKPHOS 79 01/16/2020   BILITOT 0.3 01/16/2020   Lab Results  Component Value Date   HGBA1C 5.4 01/03/2020   HGBA1C 5.2 07/26/2017   Lab Results  Component Value Date   INSULIN 13.8 01/16/2020   INSULIN CANCELED 01/03/2020   Lab Results  Component Value Date   TSH 2.000 01/16/2020   Lab Results  Component Value Date   CHOL 226 (H)  01/16/2020   HDL 48 01/16/2020   LDLCALC 150 (H) 01/16/2020   TRIG 153 (H) 01/16/2020   Lab Results  Component Value Date   WBC 8.5 01/03/2020   HGB 11.8 01/03/2020   HCT 35.7 01/03/2020   MCV 80 01/03/2020   PLT 322 01/03/2020   No results found for: IRON, TIBC, FERRITIN  Attestation Statements:   Reviewed by clinician on day of visit: allergies, medications, problem list, medical history, surgical history, family history, social history, and previous encounter notes.  Time spent on visit including pre-visit chart review and post-visit charting and care was 20 minutes.   I, Marianna Payment, am acting as transcriptionist for Reuben Likes, MD   I have reviewed the above documentation for accuracy and completeness, and I agree with the above. - Katherina Mires, MD

## 2020-03-24 ENCOUNTER — Other Ambulatory Visit (INDEPENDENT_AMBULATORY_CARE_PROVIDER_SITE_OTHER): Payer: Self-pay | Admitting: Family Medicine

## 2020-03-24 DIAGNOSIS — E8881 Metabolic syndrome: Secondary | ICD-10-CM

## 2020-04-02 ENCOUNTER — Ambulatory Visit (INDEPENDENT_AMBULATORY_CARE_PROVIDER_SITE_OTHER): Payer: Medicaid Other | Admitting: Family Medicine

## 2020-04-02 ENCOUNTER — Other Ambulatory Visit: Payer: Self-pay

## 2020-04-02 ENCOUNTER — Encounter (INDEPENDENT_AMBULATORY_CARE_PROVIDER_SITE_OTHER): Payer: Self-pay | Admitting: Family Medicine

## 2020-04-02 VITALS — BP 136/85 | HR 77 | Temp 97.8°F | Ht 69.0 in | Wt 336.0 lb

## 2020-04-02 DIAGNOSIS — E559 Vitamin D deficiency, unspecified: Secondary | ICD-10-CM | POA: Diagnosis not present

## 2020-04-02 DIAGNOSIS — Z6841 Body Mass Index (BMI) 40.0 and over, adult: Secondary | ICD-10-CM | POA: Diagnosis not present

## 2020-04-02 DIAGNOSIS — E8881 Metabolic syndrome: Secondary | ICD-10-CM

## 2020-04-02 MED ORDER — METFORMIN HCL 500 MG PO TABS: ORAL_TABLET | ORAL | 0 refills | Status: DC

## 2020-04-03 ENCOUNTER — Encounter (INDEPENDENT_AMBULATORY_CARE_PROVIDER_SITE_OTHER): Payer: Self-pay | Admitting: Family Medicine

## 2020-04-03 DIAGNOSIS — E559 Vitamin D deficiency, unspecified: Secondary | ICD-10-CM | POA: Insufficient documentation

## 2020-04-03 DIAGNOSIS — E8881 Metabolic syndrome: Secondary | ICD-10-CM | POA: Insufficient documentation

## 2020-04-03 DIAGNOSIS — E88819 Insulin resistance, unspecified: Secondary | ICD-10-CM | POA: Insufficient documentation

## 2020-04-03 NOTE — Progress Notes (Signed)
Chief Complaint:   OBESITY Rachel Cooley is here to discuss her progress with her obesity treatment plan along with follow-up of her obesity related diagnoses. Rachel Cooley is on the Category 4 Plan and states she is following her eating plan approximately 60% of the time. Rachel Cooley states she is 2 miles per day 5 times per week.  Today's visit was #: 6 Starting weight: 342 lbs Starting date: 01/03/2020 Today's weight: 336 lbs Today's date: 04/02/2020 Total lbs lost to date: 6 Total lbs lost since last in-office visit: 0  Interim History: Rachel Cooley struggles to to get all of the food on the plan. She does admit that she doesn't prep as well as she should. She wants more variety at lunch. She denies sweet sugary beverages. She is not figuring in calories from salad dressings.   Subjective:   1. Insulin resistance Lurlene has a diagnosis of insulin resistance based on her elevated fasting insulin level >5. She denies polyphagia. She does think metformin is causing itching. She continues to work on diet and exercise to decrease her risk of diabetes.  Lab Results  Component Value Date   INSULIN 13.8 01/16/2020   INSULIN CANCELED 01/03/2020   Lab Results  Component Value Date   HGBA1C 5.4 01/03/2020   2. Vitamin D deficiency Rachel Cooley's Vit D level was low at 30. She is on prescription Vit D.  Assessment/Plan:   1. Insulin resistance Rachel Cooley will continue to work on weight loss, exercise, and decreasing simple carbohydrates to help decrease the risk of diabetes. We will refill metformin for 1 month. She will discontinue for a few days to see if the itching improves. Rachel Cooley agreed to follow-up with [place] as directed to closely monitor her progress.  - metFORMIN (GLUCOPHAGE) 500 MG tablet; TAKE 1 TABLET BY MOUTH EVERY DAY WITH BREAKFAST  Dispense: 30 tablet; Refill: 0  2. Vitamin D deficiency Low Vitamin D level contributes to fatigue and are associated with obesity, breast, and colon cancer. Rachel Cooley  agreed to continue taking prescription Vitamin D 50,000 IU every week and will follow-up for routine testing of Vitamin D, at least 2-3 times per year to avoid over-replacement.  3. Class 3 severe obesity with serious comorbidity and body mass index (BMI) of 45.0 to 49.9 in adult, unspecified obesity type (HCC) Rachel Cooley is currently in the action stage of change. As such, her goal is to continue with weight loss efforts. She has agreed to the Category 4 Plan with additional lunch options.   Exercise goals: As is.  Behavioral modification strategies: increasing lean protein intake and increasing water intake. She will be more aware of extra calories added through dressings and condiments.   Rachel Cooley has agreed to follow-up with our clinic in 2 weeks. She was informed of the importance of frequent follow-up visits to maximize her success with intensive lifestyle modifications for her multiple health conditions.   Objective:   Blood pressure 136/85, pulse 77, temperature 97.8 F (36.6 C), temperature source Oral, height 5\' 9"  (1.753 m), weight (!) 336 lb (152.4 kg), SpO2 100 %, unknown if currently breastfeeding. Body mass index is 49.62 kg/m.  General: Cooperative, alert, well developed, in no acute distress. HEENT: Conjunctivae and lids unremarkable. Cardiovascular: Regular rhythm.  Lungs: Normal work of breathing. Neurologic: No focal deficits.   Lab Results  Component Value Date   CREATININE 0.62 01/16/2020   BUN 16 01/16/2020   NA 137 01/16/2020   K 4.2 01/16/2020   CL 100 01/16/2020  CO2 21 01/16/2020   Lab Results  Component Value Date   ALT 16 01/16/2020   AST 16 01/16/2020   ALKPHOS 79 01/16/2020   BILITOT 0.3 01/16/2020   Lab Results  Component Value Date   HGBA1C 5.4 01/03/2020   HGBA1C 5.2 07/26/2017   Lab Results  Component Value Date   INSULIN 13.8 01/16/2020   INSULIN CANCELED 01/03/2020   Lab Results  Component Value Date   TSH 2.000 01/16/2020   Lab  Results  Component Value Date   CHOL 226 (H) 01/16/2020   HDL 48 01/16/2020   LDLCALC 150 (H) 01/16/2020   TRIG 153 (H) 01/16/2020   Lab Results  Component Value Date   WBC 8.5 01/03/2020   HGB 11.8 01/03/2020   HCT 35.7 01/03/2020   MCV 80 01/03/2020   PLT 322 01/03/2020   No results found for: IRON, TIBC, FERRITIN  Attestation Statements:   Reviewed by clinician on day of visit: allergies, medications, problem list, medical history, surgical history, family history, social history, and previous encounter notes.   Trude Mcburney, am acting as Energy manager for [place], FNP-C.  I have reviewed the above documentation for accuracy and completeness, and I agree with the above. -  Jesse Sans, FNP

## 2020-04-16 ENCOUNTER — Encounter (INDEPENDENT_AMBULATORY_CARE_PROVIDER_SITE_OTHER): Payer: Self-pay | Admitting: Family Medicine

## 2020-04-16 ENCOUNTER — Other Ambulatory Visit (INDEPENDENT_AMBULATORY_CARE_PROVIDER_SITE_OTHER): Payer: Self-pay | Admitting: Family Medicine

## 2020-04-16 ENCOUNTER — Ambulatory Visit (INDEPENDENT_AMBULATORY_CARE_PROVIDER_SITE_OTHER): Payer: Medicaid Other | Admitting: Family Medicine

## 2020-04-16 ENCOUNTER — Other Ambulatory Visit: Payer: Self-pay

## 2020-04-16 VITALS — BP 138/86 | HR 79 | Temp 97.9°F | Ht 69.0 in | Wt 331.0 lb

## 2020-04-16 DIAGNOSIS — Z6841 Body Mass Index (BMI) 40.0 and over, adult: Secondary | ICD-10-CM

## 2020-04-16 DIAGNOSIS — Z9189 Other specified personal risk factors, not elsewhere classified: Secondary | ICD-10-CM | POA: Diagnosis not present

## 2020-04-16 DIAGNOSIS — E559 Vitamin D deficiency, unspecified: Secondary | ICD-10-CM

## 2020-04-16 DIAGNOSIS — E8881 Metabolic syndrome: Secondary | ICD-10-CM | POA: Diagnosis not present

## 2020-04-16 MED ORDER — VITAMIN D (ERGOCALCIFEROL) 1.25 MG (50000 UNIT) PO CAPS: 50000.0000 [IU] | ORAL_CAPSULE | ORAL | 0 refills | Status: DC

## 2020-04-16 MED ORDER — SAXENDA 18 MG/3ML ~~LOC~~ SOPN: 0.6000 mg | PEN_INJECTOR | Freq: Every day | SUBCUTANEOUS | 0 refills | Status: DC

## 2020-04-16 MED ORDER — VICTOZA 18 MG/3ML ~~LOC~~ SOPN: 0.6000 mg | PEN_INJECTOR | Freq: Every day | SUBCUTANEOUS | 0 refills | Status: DC

## 2020-04-16 MED ORDER — BD PEN NEEDLE NANO 2ND GEN 32G X 4 MM MISC: 1.0000 | Freq: Two times a day (BID) | 0 refills | Status: DC

## 2020-04-23 NOTE — Progress Notes (Signed)
Chief Complaint:   OBESITY Rachel Cooley is here to discuss her progress with her obesity treatment plan along with follow-up of her obesity related diagnoses. Rachel Cooley is on the Category 4 Plan with additional lunch options and states she is following her eating plan approximately 60% of the time. Rachel Cooley states she is walking 2-3 miles 5-6 times per week.  Today's visit was #: 7 Starting weight: 342 lbs Starting date: 01/03/2020 Today's weight: 331 lbs Today's date: 04/16/2020 Total lbs lost to date: 11 Total lbs lost since last in-office visit: 5  Interim History: Rachel Cooley wasn't prepared the previous two weeks. This past 2 weeks she was fluctuated between not eating or eating more indulgent food. The last few weeks she has gotten slightly more back on track.  Subjective:   1. Metabolic syndrome Rachel Cooley had elevated LDL at 150 and increased waist circumference.  2. Vitamin D deficiency Rachel Cooley denies nausea, vomiting, or muscle weakness, but she notes fatigue.  3. Insulin resistance Rachel Cooley was previously on metformin, and she denies GI side effects.  4. At risk for osteoporosis Rachel Cooley is at higher risk of osteopenia and osteoporosis due to Vitamin D deficiency.   Assessment/Plan:   1. Metabolic syndrome Rachel Cooley agreed to start Victoza 0.6 mg SubQ daily with no refills, and pen needles #100 with no refills.  - liraglutide (VICTOZA) 18 MG/3ML SOPN; Inject 0.1 mLs (0.6 mg total) into the skin daily.  Dispense: 1 pen; Refill: 0 - Insulin Pen Needle (BD PEN NEEDLE NANO 2ND GEN) 32G X 4 MM MISC; 1 Package by Does not apply route 2 (two) times daily.  Dispense: 100 each; Refill: 0  2. Vitamin D deficiency Low Vitamin D level contributes to fatigue and are associated with obesity, breast, and colon cancer. We will refill prescription Vitamin D for 1 month. Rachel Cooley will follow-up for routine testing of Vitamin D, at least 2-3 times per year to avoid over-replacement.  - Vitamin D,  Ergocalciferol, (DRISDOL) 1.25 MG (50000 UNIT) CAPS capsule; Take 1 capsule (50,000 Units total) by mouth every 7 (seven) days.  Dispense: 4 capsule; Refill: 0  3. Insulin resistance Rachel Cooley will continue to work on weight loss, exercise, and decreasing simple carbohydrates to help decrease the risk of diabetes. Rachel Cooley agreed to discontinue metformin and start Victoza. Rachel Cooley agreed to follow-up with [place] as directed to closely monitor her progress.  - liraglutide (VICTOZA) 18 MG/3ML SOPN; Inject 0.1 mLs (0.6 mg total) into the skin daily.  Dispense: 1 pen; Refill: 0 - Insulin Pen Needle (BD PEN NEEDLE NANO 2ND GEN) 32G X 4 MM MISC; 1 Package by Does not apply route 2 (two) times daily.  Dispense: 100 each; Refill: 0  4. At risk for osteoporosis Rachel Cooley was given approximately 15 minutes of osteoporosis prevention counseling today. Rachel Cooley is at risk for osteopenia and osteoporosis due to her Vitamin D deficiency. She was encouraged to take her Vitamin D and follow her higher calcium diet and increase strengthening exercise to help strengthen her bones and decrease her risk of osteopenia and osteoporosis.  Repetitive spaced learning was employed today to elicit superior memory formation and behavioral change.  5. Class 3 severe obesity with serious comorbidity and body mass index (BMI) of 45.0 to 49.9 in adult, unspecified obesity type (HCC) Rachel Cooley is currently in the action stage of change. As such, her goal is to continue with weight loss efforts. She has agreed to the Category 4 Plan.   Exercise goals: As is.  Behavioral  modification strategies: increasing lean protein intake, meal planning and cooking strategies, keeping healthy foods in the home and planning for success.  Rachel Cooley has agreed to follow-up with our clinic in 2 to 3 weeks. She was informed of the importance of frequent follow-up visits to maximize her success with intensive lifestyle modifications for her multiple health conditions.     Objective:   Blood pressure 138/86, pulse 79, temperature 97.9 F (36.6 C), temperature source Oral, height 5\' 9"  (1.753 m), weight (!) 331 lb (150.1 kg), last menstrual period 04/11/2020, SpO2 98 %, unknown if currently breastfeeding. Body mass index is 48.88 kg/m.  General: Cooperative, alert, well developed, in no acute distress. HEENT: Conjunctivae and lids unremarkable. Cardiovascular: Regular rhythm.  Lungs: Normal work of breathing. Neurologic: No focal deficits.   Lab Results  Component Value Date   CREATININE 0.62 01/16/2020   BUN 16 01/16/2020   NA 137 01/16/2020   K 4.2 01/16/2020   CL 100 01/16/2020   CO2 21 01/16/2020   Lab Results  Component Value Date   ALT 16 01/16/2020   AST 16 01/16/2020   ALKPHOS 79 01/16/2020   BILITOT 0.3 01/16/2020   Lab Results  Component Value Date   HGBA1C 5.4 01/03/2020   HGBA1C 5.2 07/26/2017   Lab Results  Component Value Date   INSULIN 13.8 01/16/2020   INSULIN CANCELED 01/03/2020   Lab Results  Component Value Date   TSH 2.000 01/16/2020   Lab Results  Component Value Date   CHOL 226 (H) 01/16/2020   HDL 48 01/16/2020   LDLCALC 150 (H) 01/16/2020   TRIG 153 (H) 01/16/2020   Lab Results  Component Value Date   WBC 8.5 01/03/2020   HGB 11.8 01/03/2020   HCT 35.7 01/03/2020   MCV 80 01/03/2020   PLT 322 01/03/2020   No results found for: IRON, TIBC, FERRITIN  Attestation Statements:   Reviewed by clinician on day of visit: allergies, medications, problem list, medical history, surgical history, family history, social history, and previous encounter notes.   I, 01/05/2020, am acting as transcriptionist for Burt Knack, MD.  I have reviewed the above documentation for accuracy and completeness, and I agree with the above. - Reuben Likes, MD

## 2020-05-03 ENCOUNTER — Other Ambulatory Visit: Payer: Self-pay | Admitting: Nurse Practitioner

## 2020-05-03 NOTE — Telephone Encounter (Signed)
Requested  medications are  due for refill today yes  Requested medications are on the active medication list yes  Last refill 6/7  Last visit 06/2019  Future visit scheduled No  Notes to clinic Failed protocol of visit within 6 months

## 2020-05-06 NOTE — Telephone Encounter (Signed)
LOV: 03/22/201

## 2020-05-09 ENCOUNTER — Encounter (INDEPENDENT_AMBULATORY_CARE_PROVIDER_SITE_OTHER): Payer: Self-pay | Admitting: Family Medicine

## 2020-05-09 ENCOUNTER — Other Ambulatory Visit: Payer: Self-pay

## 2020-05-09 ENCOUNTER — Ambulatory Visit (INDEPENDENT_AMBULATORY_CARE_PROVIDER_SITE_OTHER): Payer: Medicaid Other | Admitting: Family Medicine

## 2020-05-09 VITALS — BP 116/77 | HR 79 | Temp 97.6°F | Ht 69.0 in | Wt 329.0 lb

## 2020-05-09 DIAGNOSIS — E7849 Other hyperlipidemia: Secondary | ICD-10-CM | POA: Diagnosis not present

## 2020-05-09 DIAGNOSIS — E8881 Metabolic syndrome: Secondary | ICD-10-CM | POA: Diagnosis not present

## 2020-05-09 DIAGNOSIS — E559 Vitamin D deficiency, unspecified: Secondary | ICD-10-CM

## 2020-05-09 DIAGNOSIS — Z6841 Body Mass Index (BMI) 40.0 and over, adult: Secondary | ICD-10-CM

## 2020-05-09 NOTE — Progress Notes (Signed)
Chief Complaint:   OBESITY Rachel Cooley is here to discuss her progress with her obesity treatment plan along with follow-up of her obesity related diagnoses. Rachel Cooley is on the Category 4 Plan and states she is following her eating plan approximately 90% of the time. Rachel Cooley states she is walking 2-4 miles 7 times per week.  Today's visit was #: 8 Starting weight: 342 lbs Starting date: 01/03/2020 Today's weight: 329 lbs Today's date: 05/09/2020 Total lbs lost to date: 13 Total lbs lost since last in-office visit: 2  Interim History: Tarhonda started [employer] with no issues, and no issues with getting all of the food in but occasionally not hungry. She does mention there are times she makes substitutions but that she still gets all nutrition in.  Subjective:   1. Other hyperlipidemia Rachel Cooley's last LDL was 150, HDL 48, and triglycerides 341. She is not on statin.  2. Insulin resistance Rachel Cooley's last A1c was 5.4 and insulin 13.8. She is on Victoza and denies GI side effects.  3. Vitamin D deficiency Rachel Cooley denies nausea, vomiting, or muscle weakness, but she notes fatigue. She is on prescription Vit D.  Assessment/Plan:   1. Other hyperlipidemia Cardiovascular risk and specific lipid/LDL goals reviewed. We discussed several lifestyle modifications today. Rachel Cooley will continue to work on diet, exercise and weight loss efforts. We will check labs today. Orders and follow up as documented in patient record.   Counseling Intensive lifestyle modifications are the first line treatment for this issue. . Dietary changes: Increase soluble fiber. Decrease simple carbohydrates. . Exercise changes: Moderate to vigorous-intensity aerobic activity 150 minutes per week if tolerated. . Lipid-lowering medications: see documented in medical record.  - Lipid Panel With LDL/HDL Ratio  2. Insulin resistance Rachel Cooley will continue to work on weight loss, exercise, and decreasing simple carbohydrates to help  decrease the risk of diabetes. We will check labs today, and Rachel Cooley agreed to increase Victoza to 0.9 mg. Rachel Cooley agreed to follow-up with [place] as directed to closely monitor her progress.  - Insulin, random - Hemoglobin A1c  3. Vitamin D deficiency Low Vitamin D level contributes to fatigue and are associated with obesity, breast, and colon cancer. We will check labs today. Rachel Cooley agreed to continue taking prescription Vitamin D 50,000 IU every week and will follow-up for routine testing of Vitamin D, at least 2-3 times per year to avoid over-replacement.  - VITAMIN D 25 Hydroxy (Vit-D Deficiency, Fractures)  4. Class 3 severe obesity with serious comorbidity and body mass index (BMI) of 45.0 to 49.9 in adult, unspecified obesity type (HCC) Rachel Cooley is currently in the action stage of change. As such, her goal is to continue with weight loss efforts. She has agreed to the Category 4 Plan.   Exercise goals: All adults should avoid inactivity. Some physical activity is better than none, and adults who participate in any amount of physical activity gain some health benefits.  Behavioral modification strategies: increasing lean protein intake, increasing vegetables, meal planning and cooking strategies, keeping healthy foods in the home and planning for success.  Rachel Cooley has agreed to follow-up with our clinic in 2 to 3 weeks. She was informed of the importance of frequent follow-up visits to maximize her success with intensive lifestyle modifications for her multiple health conditions.   Rachel Cooley was informed we would discuss her lab results at her next visit unless there is a critical issue that needs to be addressed sooner. Rachel Cooley agreed to keep her next visit at the  agreed upon time to discuss these results.  Objective:   Blood pressure 116/77, pulse 79, temperature 97.6 F (36.4 C), temperature source Oral, height 5\' 9"  (1.753 m), weight (!) 329 lb (149.2 kg), last menstrual period 05/08/2020, SpO2  99 %, unknown if currently breastfeeding. Body mass index is 48.58 kg/m.  General: Cooperative, alert, well developed, in no acute distress. HEENT: Conjunctivae and lids unremarkable. Cardiovascular: Regular rhythm.  Lungs: Normal work of breathing. Neurologic: No focal deficits.   Lab Results  Component Value Date   CREATININE 0.62 01/16/2020   BUN 16 01/16/2020   NA 137 01/16/2020   K 4.2 01/16/2020   CL 100 01/16/2020   CO2 21 01/16/2020   Lab Results  Component Value Date   ALT 16 01/16/2020   AST 16 01/16/2020   ALKPHOS 79 01/16/2020   BILITOT 0.3 01/16/2020   Lab Results  Component Value Date   HGBA1C 5.4 01/03/2020   HGBA1C 5.2 07/26/2017   Lab Results  Component Value Date   INSULIN 13.8 01/16/2020   INSULIN CANCELED 01/03/2020   Lab Results  Component Value Date   TSH 2.000 01/16/2020   Lab Results  Component Value Date   CHOL 226 (H) 01/16/2020   HDL 48 01/16/2020   LDLCALC 150 (H) 01/16/2020   TRIG 153 (H) 01/16/2020   Lab Results  Component Value Date   WBC 8.5 01/03/2020   HGB 11.8 01/03/2020   HCT 35.7 01/03/2020   MCV 80 01/03/2020   PLT 322 01/03/2020   No results found for: IRON, TIBC, FERRITIN  Attestation Statements:   Reviewed by clinician on day of visit: allergies, medications, problem list, medical history, surgical history, family history, social history, and previous encounter notes.  Time spent on visit including pre-visit chart review and post-visit care and charting was 25 minutes.    I, 01/05/2020, am acting as transcriptionist for Burt Knack, MD.  I have reviewed the above documentation for accuracy and completeness, and I agree with the above. - Reuben Likes, MD

## 2020-05-10 LAB — LIPID PANEL WITH LDL/HDL RATIO
Cholesterol, Total: 257 mg/dL — ABNORMAL HIGH (ref 100–199)
HDL: 41 mg/dL (ref >39)
LDL Chol Calc (NIH): 183 mg/dL — ABNORMAL HIGH (ref 0–99)
LDL/HDL Ratio: 4.5 ratio — ABNORMAL HIGH (ref 0.0–3.2)
Triglycerides: 177 mg/dL — ABNORMAL HIGH (ref 0–149)
VLDL Cholesterol Cal: 33 mg/dL (ref 5–40)

## 2020-05-10 LAB — INSULIN, RANDOM: INSULIN: 16.7 u[IU]/mL (ref 2.6–24.9)

## 2020-05-10 LAB — HEMOGLOBIN A1C
Est. average glucose Bld gHb Est-mCnc: 114 mg/dL
Hgb A1c MFr Bld: 5.6 % (ref 4.8–5.6)

## 2020-05-10 LAB — VITAMIN D 25 HYDROXY (VIT D DEFICIENCY, FRACTURES): Vit D, 25-Hydroxy: 39.2 ng/mL (ref 30.0–100.0)

## 2020-05-11 ENCOUNTER — Encounter (INDEPENDENT_AMBULATORY_CARE_PROVIDER_SITE_OTHER): Payer: Self-pay | Admitting: Family Medicine

## 2020-05-13 ENCOUNTER — Other Ambulatory Visit (INDEPENDENT_AMBULATORY_CARE_PROVIDER_SITE_OTHER): Payer: Self-pay

## 2020-05-13 DIAGNOSIS — E559 Vitamin D deficiency, unspecified: Secondary | ICD-10-CM

## 2020-05-13 MED ORDER — VITAMIN D (ERGOCALCIFEROL) 1.25 MG (50000 UNIT) PO CAPS: 50000.0000 [IU] | ORAL_CAPSULE | ORAL | 0 refills | Status: DC

## 2020-05-15 ENCOUNTER — Other Ambulatory Visit (INDEPENDENT_AMBULATORY_CARE_PROVIDER_SITE_OTHER): Payer: Self-pay | Admitting: Family Medicine

## 2020-05-15 DIAGNOSIS — E559 Vitamin D deficiency, unspecified: Secondary | ICD-10-CM

## 2020-06-03 ENCOUNTER — Ambulatory Visit (INDEPENDENT_AMBULATORY_CARE_PROVIDER_SITE_OTHER): Payer: Medicaid Other | Admitting: Family Medicine

## 2020-06-04 ENCOUNTER — Ambulatory Visit (INDEPENDENT_AMBULATORY_CARE_PROVIDER_SITE_OTHER): Payer: Medicaid Other | Admitting: Bariatrics

## 2020-06-04 ENCOUNTER — Encounter (INDEPENDENT_AMBULATORY_CARE_PROVIDER_SITE_OTHER): Payer: Self-pay | Admitting: Bariatrics

## 2020-06-04 ENCOUNTER — Other Ambulatory Visit: Payer: Self-pay

## 2020-06-04 VITALS — BP 134/76 | HR 84 | Temp 97.6°F | Ht 69.0 in | Wt 326.0 lb

## 2020-06-04 DIAGNOSIS — E8881 Metabolic syndrome: Secondary | ICD-10-CM

## 2020-06-04 DIAGNOSIS — E559 Vitamin D deficiency, unspecified: Secondary | ICD-10-CM | POA: Diagnosis not present

## 2020-06-04 DIAGNOSIS — Z6841 Body Mass Index (BMI) 40.0 and over, adult: Secondary | ICD-10-CM | POA: Diagnosis not present

## 2020-06-04 MED ORDER — VICTOZA 18 MG/3ML ~~LOC~~ SOPN: 0.6000 mg | PEN_INJECTOR | Freq: Every day | SUBCUTANEOUS | 0 refills | Status: DC

## 2020-06-04 MED ORDER — VITAMIN D (ERGOCALCIFEROL) 1.25 MG (50000 UNIT) PO CAPS: 50000.0000 [IU] | ORAL_CAPSULE | ORAL | 0 refills | Status: DC

## 2020-06-04 NOTE — Progress Notes (Signed)
Chief Complaint:   OBESITY [name] is here to discuss her progress with her obesity treatment plan along with follow-up of her obesity related diagnoses. Rachel Cooley is on the Category 4 Plan and states she is following her eating plan approximately 75% of the time. [name] states she is walking 2-3 miles in steps 7 times per week.  Today's visit was #: 9 Starting weight: 342 lbs Starting date: 01/03/2020 Today's weight: 326 lbs Today's date: 06/04/2020 Total lbs lost to date: 16 Total lbs lost since last in-office visit: 3  Interim History: [name] is down 3 lbs and doing well overall.  Subjective:   Insulin resistance. Rachel Cooley has a diagnosis of insulin resistance based on her elevated fasting insulin level >5. She continues to work on diet and exercise to decrease her risk of diabetes. Rachel Cooley is taking Victoza 0.6 mg and denies side effects. She states it is helping some with her appetite.  Lab Results  Component Value Date   INSULIN 16.7 05/09/2020   INSULIN 13.8 01/16/2020   INSULIN CANCELED 01/03/2020   Lab Results  Component Value Date   HGBA1C 5.6 05/09/2020   Vitamin D deficiency. No nausea, vomiting, or muscle weakness.    Ref. Range 05/09/2020 09:43  Vitamin D, 25-Hydroxy Latest Ref Range: 30.0 - 100.0 ng/mL 39.2   Assessment/Plan:   Insulin resistance. Rachel Cooley will continue to work on weight loss, exercise, and decreasing simple carbohydrates to help decrease the risk of diabetes. [name] agreed to follow-up with [place] as directed to closely monitor her progress. Prescription was given for liraglutide (VICTOZA) 18 MG/3ML SOPN 0.6 mg total into skin daily, 1 month supply with 0 refills.  Vitamin D deficiency. Low Vitamin D level contributes to fatigue and are associated with obesity, breast, and colon cancer. She was given a prescription for Vitamin D, Ergocalciferol, (DRISDOL) 1.25 MG (50000 UNIT) CAPS capsule every week #4 with 0 refills and will follow-up for  routine testing of Vitamin D, at least 2-3 times per year to avoid over-replacement.   Class 3 severe obesity with serious comorbidity and body mass index (BMI) of 45.0 to 49.9 in adult, unspecified obesity type (HCC).  Rachel Cooley is currently in the action stage of change. As such, her goal is to continue with weight loss efforts. She has agreed to the Category 4 Plan.   She will work on meal planning, intentional eating, increasing her water intake, and not skipping meals.  Exercise goals: Rachel Cooley will continue her current exercise regimen.   Behavioral modification strategies: increasing lean protein intake, decreasing simple carbohydrates, increasing vegetables, increasing water intake, decreasing eating out, no skipping meals, meal planning and cooking strategies, keeping healthy foods in the home and planning for success.  Rachel Cooley has agreed to follow-up with our clinic in 2-3 weeks. She was informed of the importance of frequent follow-up visits to maximize her success with intensive lifestyle modifications for her multiple health conditions.   Objective:   Blood pressure 134/76, pulse 84, temperature 97.6 F (36.4 C), height 5\' 9"  (1.753 m), weight (!) 326 lb (147.9 kg), last menstrual period 06/03/2020, SpO2 100 %, unknown if currently breastfeeding. Body mass index is 48.14 kg/m.  General: Cooperative, alert, well developed, in no acute distress. HEENT: Conjunctivae and lids unremarkable. Cardiovascular: Regular rhythm.  Lungs: Normal work of breathing. Neurologic: No focal deficits.   Lab Results  Component Value Date   CREATININE 0.62 01/16/2020   BUN 16 01/16/2020   NA 137 01/16/2020  K 4.2 01/16/2020   CL 100 01/16/2020   CO2 21 01/16/2020   Lab Results  Component Value Date   ALT 16 01/16/2020   AST 16 01/16/2020   ALKPHOS 79 01/16/2020   BILITOT 0.3 01/16/2020   Lab Results  Component Value Date   HGBA1C 5.6 05/09/2020   HGBA1C 5.4 01/03/2020   HGBA1C 5.2  07/26/2017   Lab Results  Component Value Date   INSULIN 16.7 05/09/2020   INSULIN 13.8 01/16/2020   INSULIN CANCELED 01/03/2020   Lab Results  Component Value Date   TSH 2.000 01/16/2020   Lab Results  Component Value Date   CHOL 257 (H) 05/09/2020   HDL 41 05/09/2020   LDLCALC 183 (H) 05/09/2020   TRIG 177 (H) 05/09/2020   Lab Results  Component Value Date   WBC 8.5 01/03/2020   HGB 11.8 01/03/2020   HCT 35.7 01/03/2020   MCV 80 01/03/2020   PLT 322 01/03/2020   No results found for: IRON, TIBC, FERRITIN  Attestation Statements:   Reviewed by clinician on day of visit: allergies, medications, problem list, medical history, surgical history, family history, social history, and previous encounter notes.  Fernanda Drum, am acting as Energy manager for [employer], DO   I have reviewed the above documentation for accuracy and completeness, and I agree with the above. Corinna Capra, DO

## 2020-06-11 ENCOUNTER — Other Ambulatory Visit (INDEPENDENT_AMBULATORY_CARE_PROVIDER_SITE_OTHER): Payer: Self-pay | Admitting: Family Medicine

## 2020-06-11 DIAGNOSIS — E559 Vitamin D deficiency, unspecified: Secondary | ICD-10-CM

## 2020-06-24 ENCOUNTER — Other Ambulatory Visit: Payer: Self-pay

## 2020-06-24 ENCOUNTER — Ambulatory Visit (INDEPENDENT_AMBULATORY_CARE_PROVIDER_SITE_OTHER): Payer: Medicaid Other | Admitting: Family Medicine

## 2020-06-24 ENCOUNTER — Encounter (INDEPENDENT_AMBULATORY_CARE_PROVIDER_SITE_OTHER): Payer: Self-pay | Admitting: Family Medicine

## 2020-06-24 VITALS — BP 119/77 | HR 97 | Temp 97.8°F | Ht 69.0 in | Wt 326.0 lb

## 2020-06-24 DIAGNOSIS — E559 Vitamin D deficiency, unspecified: Secondary | ICD-10-CM | POA: Diagnosis not present

## 2020-06-24 DIAGNOSIS — Z6841 Body Mass Index (BMI) 40.0 and over, adult: Secondary | ICD-10-CM

## 2020-06-24 DIAGNOSIS — E8881 Metabolic syndrome: Secondary | ICD-10-CM | POA: Diagnosis not present

## 2020-06-24 MED ORDER — VICTOZA 18 MG/3ML ~~LOC~~ SOPN: 1.2000 mg | PEN_INJECTOR | Freq: Every day | SUBCUTANEOUS | 0 refills | Status: DC

## 2020-06-25 NOTE — Progress Notes (Signed)
Chief Complaint:   OBESITY Rachel Cooley is here to discuss her progress with her obesity treatment plan along with follow-up of her obesity related diagnoses. Rachel Cooley is on the Category 4 Plan and states she is following her eating plan approximately 50% of the time. Rachel Cooley states she is walking 2-3 miles 4 times per week.   Today's visit was #: 10 Starting weight: 342 lbs Starting date: 01/03/2020 Today's weight: 326 lbs Today's date: 06/24/2020 Total lbs lost to date: 16 Total lbs lost since last in-office visit: 0  Interim History: Rachel Cooley is on 0.9 mg of Victoza. Last appointment prior to Labor Day weekend. She has been adjusting to a new schedule with her daughters starting play school and then restarting dance. She hasn't prepped food as much as she previously had. She has had numerous things pop up over the last few weeks.  Subjective:   1. Metabolic syndrome Rachel Cooley is on Victoza 0.9 mg daily. Last LDL was 18.3.  2. Vitamin D deficiency Rachel Cooley denies nausea, vomiting, or muscle weakness, but she notes fatigue. She is on prescription Vit D.  Assessment/Plan:   1. Metabolic syndrome Rachel Cooley agreed to increase Victoza to 1.2 mg SubQ daily with no refills.  - liraglutide (VICTOZA) 18 MG/3ML SOPN; Inject 1.2 mg into the skin daily.  Dispense: 0.1 mL; Refill: 0  2. Vitamin D deficiency Low Vitamin D level contributes to fatigue and are associated with obesity, breast, and colon cancer. Rachel Cooley agreed to continue taking prescription Vitamin D 50,000 IU every week, no refill needed. She will follow-up for routine testing of Vitamin D, at least 2-3 times per year to avoid over-replacement.  3. Class 3 severe obesity with serious comorbidity and body mass index (BMI) of 45.0 to 49.9 in adult, unspecified obesity type (HCC) Rachel Cooley is currently in the action stage of change. As such, her goal is to continue with weight loss efforts. She has agreed to the Category 4 Plan.   Exercise goals:  All adults should avoid inactivity. Some physical activity is better than none, and adults who participate in any amount of physical activity gain some health benefits.  Behavioral modification strategies: increasing lean protein intake, increasing vegetables, meal planning and cooking strategies, keeping healthy foods in the home and planning for success.  Rachel Cooley has agreed to follow-up with our clinic in 2 to 3 weeks. She was informed of the importance of frequent follow-up visits to maximize her success with intensive lifestyle modifications for her multiple health conditions.   Objective:   Blood pressure 119/77, pulse 97, temperature 97.8 F (36.6 C), temperature source Oral, height 5\' 9"  (1.753 m), weight (!) 326 lb (147.9 kg), last menstrual period 06/03/2020, SpO2 98 %, unknown if currently breastfeeding. Body mass index is 48.14 kg/m.  General: Cooperative, alert, well developed, in no acute distress. HEENT: Conjunctivae and lids unremarkable. Cardiovascular: Regular rhythm.  Lungs: Normal work of breathing. Neurologic: No focal deficits.   Lab Results  Component Value Date   CREATININE 0.62 01/16/2020   BUN 16 01/16/2020   NA 137 01/16/2020   K 4.2 01/16/2020   CL 100 01/16/2020   CO2 21 01/16/2020   Lab Results  Component Value Date   ALT 16 01/16/2020   AST 16 01/16/2020   ALKPHOS 79 01/16/2020   BILITOT 0.3 01/16/2020   Lab Results  Component Value Date   HGBA1C 5.6 05/09/2020   HGBA1C 5.4 01/03/2020   HGBA1C 5.2 07/26/2017   Lab Results  Component Value  Date   INSULIN 16.7 05/09/2020   INSULIN 13.8 01/16/2020   INSULIN CANCELED 01/03/2020   Lab Results  Component Value Date   TSH 2.000 01/16/2020   Lab Results  Component Value Date   CHOL 257 (H) 05/09/2020   HDL 41 05/09/2020   LDLCALC 183 (H) 05/09/2020   TRIG 177 (H) 05/09/2020   Lab Results  Component Value Date   WBC 8.5 01/03/2020   HGB 11.8 01/03/2020   HCT 35.7 01/03/2020   MCV 80  01/03/2020   PLT 322 01/03/2020   No results found for: IRON, TIBC, FERRITIN  Attestation Statements:   Reviewed by clinician on day of visit: allergies, medications, problem list, medical history, surgical history, family history, social history, and previous encounter notes.  Time spent on visit including pre-visit chart review and post-visit care and charting was 16 minutes.    I, Burt Knack, am acting as transcriptionist for Reuben Likes, MD.  I have reviewed the above documentation for accuracy and completeness, and I agree with the above. - Katherina Mires, MD

## 2020-07-09 ENCOUNTER — Other Ambulatory Visit (INDEPENDENT_AMBULATORY_CARE_PROVIDER_SITE_OTHER): Payer: Self-pay

## 2020-07-09 ENCOUNTER — Ambulatory Visit (INDEPENDENT_AMBULATORY_CARE_PROVIDER_SITE_OTHER): Payer: Medicaid Other | Admitting: Family Medicine

## 2020-07-09 ENCOUNTER — Encounter (INDEPENDENT_AMBULATORY_CARE_PROVIDER_SITE_OTHER): Payer: Self-pay | Admitting: Family Medicine

## 2020-07-09 ENCOUNTER — Other Ambulatory Visit: Payer: Self-pay

## 2020-07-09 VITALS — BP 130/81 | HR 81 | Temp 98.1°F | Ht 69.0 in | Wt 327.0 lb

## 2020-07-09 DIAGNOSIS — E8881 Metabolic syndrome: Secondary | ICD-10-CM

## 2020-07-09 DIAGNOSIS — E66813 Obesity, class 3: Secondary | ICD-10-CM

## 2020-07-09 DIAGNOSIS — Z6841 Body Mass Index (BMI) 40.0 and over, adult: Secondary | ICD-10-CM

## 2020-07-09 DIAGNOSIS — Z9189 Other specified personal risk factors, not elsewhere classified: Secondary | ICD-10-CM

## 2020-07-09 DIAGNOSIS — E559 Vitamin D deficiency, unspecified: Secondary | ICD-10-CM | POA: Diagnosis not present

## 2020-07-09 DIAGNOSIS — E88819 Insulin resistance, unspecified: Secondary | ICD-10-CM

## 2020-07-09 MED ORDER — BD PEN NEEDLE NANO 2ND GEN 32G X 4 MM MISC: 1.0000 | Freq: Two times a day (BID) | 0 refills | Status: DC

## 2020-07-09 MED ORDER — VICTOZA 18 MG/3ML ~~LOC~~ SOPN: 1.8000 mg | PEN_INJECTOR | Freq: Every day | SUBCUTANEOUS | 0 refills | Status: DC

## 2020-07-09 MED ORDER — VITAMIN D (ERGOCALCIFEROL) 1.25 MG (50000 UNIT) PO CAPS: 50000.0000 [IU] | ORAL_CAPSULE | ORAL | 0 refills | Status: DC

## 2020-07-09 NOTE — Progress Notes (Signed)
Chief Complaint:   OBESITY Rachel Cooley is here to discuss her progress with her obesity treatment plan along with follow-up of her obesity related diagnoses. Rachel Cooley is on the Category 4 Plan and states she is following her eating plan approximately 50% of the time. Rachel Cooley states she is walking 2-3 miles 4-5 times per week.   Today's visit was #: 11 Starting weight: 342 lbs Starting date: 01/03/2020 Today's weight: 327 lbs Today's date: 07/09/2020 Total lbs lost to date: 15 Total lbs lost since last in-office visit: 0  Interim History: Rachel Cooley voices the last few weeks has been busy. Yesterday her daughters didn't nap so she didn't get lunch in. Breakfast tends to be eaten everyday. Supper has been cereal and banana. She wants something light for dinner. She is struggling to meals in with increased busy scheduled.  Subjective:   1. Insulin resistance Rachel Cooley is on 1.2 mg SubQ and she denies GI side effects. She is still experiencing carbohydrate cravings.  2. Vitamin D deficiency Rachel Cooley denies nausea, vomiting, or muscle weakness, but notes fatigue. She is on prescription Vit D.  3. At risk for deficient intake of food The patient is at a higher than average risk of deficient intake of food due to skipping meals.  Assessment/Plan:   1. Insulin resistance Rachel Cooley will continue to work on weight loss, exercise, and decreasing simple carbohydrates to help decrease the risk of diabetes. Rachel Cooley agreed to increase Victoza to 1.5 mg SubQ daily and we will refill for 1 month, and we will refill pen needles #100, with no refill. Rachel Cooley agreed to follow-up with [place] as directed to closely monitor her progress.  - Insulin Pen Needle (BD PEN NEEDLE NANO 2ND GEN) 32G X 4 MM MISC; 1 Package by Does not apply route 2 (two) times daily.  Dispense: 100 each; Refill: 0 - liraglutide (VICTOZA) 18 MG/3ML SOPN; Inject 1.8 mg into the skin daily.  Dispense: 9 mL; Refill: 0  2. Vitamin D deficiency Low  Vitamin D level contributes to fatigue and are associated with obesity, breast, and colon cancer. We will refill prescription Vitamin D for 1 month. Rachel Cooley will follow-up for routine testing of Vitamin D, at least 2-3 times per year to avoid over-replacement.  - Vitamin D, Ergocalciferol, (DRISDOL) 1.25 MG (50000 UNIT) CAPS capsule; Take 1 capsule (50,000 Units total) by mouth every 7 (seven) days.  Dispense: 4 capsule; Refill: 0  3. At risk for deficient intake of food Rachel Cooley was given approximately 15 minutes of deficit intake of food prevention counseling today. Rachel Cooley is at risk for eating too few calories based on current food recall. She was encouraged to focus on meeting caloric and protein goals according to her recommended meal plan.   4. Class 3 severe obesity with serious comorbidity and body mass index (BMI) of 45.0 to 49.9 in adult, unspecified obesity type (HCC) Rachel Cooley is currently in the action stage of change. As such, her goal is to continue with weight loss efforts. She has agreed to the Category 4 Plan.   Exercise goals: All adults should avoid inactivity. Some physical activity is better than none, and adults who participate in any amount of physical activity gain some health benefits.  Behavioral modification strategies: increasing lean protein intake, increasing vegetables, meal planning and cooking strategies, keeping healthy foods in the home and planning for success.  Rachel Cooley has agreed to follow-up with our clinic in 2 weeks. She was informed of the importance of frequent follow-up visits  to maximize her success with intensive lifestyle modifications for her multiple health conditions.   Objective:   Blood pressure 130/81, pulse 81, temperature 98.1 F (36.7 C), height 5\' 9"  (1.753 m), weight (!) 327 lb (148.3 kg), SpO2 99 %, unknown if currently breastfeeding. Body mass index is 48.29 kg/m.  General: Cooperative, alert, well developed, in no acute distress. HEENT:  Conjunctivae and lids unremarkable. Cardiovascular: Regular rhythm.  Lungs: Normal work of breathing. Neurologic: No focal deficits.   Lab Results  Component Value Date   CREATININE 0.62 01/16/2020   BUN 16 01/16/2020   NA 137 01/16/2020   K 4.2 01/16/2020   CL 100 01/16/2020   CO2 21 01/16/2020   Lab Results  Component Value Date   ALT 16 01/16/2020   AST 16 01/16/2020   ALKPHOS 79 01/16/2020   BILITOT 0.3 01/16/2020   Lab Results  Component Value Date   HGBA1C 5.6 05/09/2020   HGBA1C 5.4 01/03/2020   HGBA1C 5.2 07/26/2017   Lab Results  Component Value Date   INSULIN 16.7 05/09/2020   INSULIN 13.8 01/16/2020   INSULIN CANCELED 01/03/2020   Lab Results  Component Value Date   TSH 2.000 01/16/2020   Lab Results  Component Value Date   CHOL 257 (H) 05/09/2020   HDL 41 05/09/2020   LDLCALC 183 (H) 05/09/2020   TRIG 177 (H) 05/09/2020   Lab Results  Component Value Date   WBC 8.5 01/03/2020   HGB 11.8 01/03/2020   HCT 35.7 01/03/2020   MCV 80 01/03/2020   PLT 322 01/03/2020   No results found for: IRON, TIBC, FERRITIN  Attestation Statements:   Reviewed by clinician on day of visit: allergies, medications, problem list, medical history, surgical history, family history, social history, and previous encounter notes.   I, 01/05/2020, am acting as transcriptionist for Burt Knack, MD.  I have reviewed the above documentation for accuracy and completeness, and I agree with the above. - Reuben Likes, MD

## 2020-07-10 ENCOUNTER — Other Ambulatory Visit (INDEPENDENT_AMBULATORY_CARE_PROVIDER_SITE_OTHER): Payer: Self-pay | Admitting: Bariatrics

## 2020-07-10 ENCOUNTER — Other Ambulatory Visit (INDEPENDENT_AMBULATORY_CARE_PROVIDER_SITE_OTHER): Payer: Self-pay | Admitting: Family Medicine

## 2020-07-10 DIAGNOSIS — E559 Vitamin D deficiency, unspecified: Secondary | ICD-10-CM

## 2020-07-10 DIAGNOSIS — E8881 Metabolic syndrome: Secondary | ICD-10-CM

## 2020-07-15 ENCOUNTER — Encounter (INDEPENDENT_AMBULATORY_CARE_PROVIDER_SITE_OTHER): Payer: Self-pay | Admitting: Family Medicine

## 2020-07-15 NOTE — Telephone Encounter (Signed)
Please advise 

## 2020-07-17 ENCOUNTER — Telehealth: Payer: Self-pay | Admitting: Nurse Practitioner

## 2020-07-17 NOTE — Telephone Encounter (Signed)
Pt understood and verbalized understanding.  

## 2020-07-17 NOTE — Telephone Encounter (Signed)
As long as has camera and can see areas of concern.

## 2020-07-17 NOTE — Telephone Encounter (Signed)
Patient would like a referral for a dermatologist.  She is itching on both arms and her trunk.  Please notify patient when referral has been requested.

## 2020-07-17 NOTE — Telephone Encounter (Signed)
Will need appointment please

## 2020-07-19 ENCOUNTER — Encounter: Payer: Self-pay | Admitting: Nurse Practitioner

## 2020-07-19 ENCOUNTER — Telehealth (INDEPENDENT_AMBULATORY_CARE_PROVIDER_SITE_OTHER): Payer: Medicaid Other | Admitting: Nurse Practitioner

## 2020-07-19 VITALS — Ht 69.0 in | Wt 325.0 lb

## 2020-07-19 DIAGNOSIS — L299 Pruritus, unspecified: Secondary | ICD-10-CM | POA: Insufficient documentation

## 2020-07-19 MED ORDER — GABAPENTIN 300 MG PO CAPS: 300.0000 mg | ORAL_CAPSULE | Freq: Two times a day (BID) | ORAL | 2 refills | Status: DC

## 2020-07-19 MED ORDER — VENLAFAXINE HCL ER 75 MG PO CP24
ORAL_CAPSULE | ORAL | 4 refills | Status: DC
Start: 2020-07-19 — End: 2020-08-06

## 2020-07-19 MED ORDER — TRIAMCINOLONE ACETONIDE 0.1 % EX CREA: 1.0000 "application " | TOPICAL_CREAM | Freq: Two times a day (BID) | CUTANEOUS | 0 refills | Status: DC

## 2020-07-19 MED ORDER — BUPROPION HCL ER (XL) 150 MG PO TB24
150.0000 mg | ORAL_TABLET | Freq: Every day | ORAL | 4 refills | Status: DC
Start: 2020-07-19 — End: 2020-08-06

## 2020-07-19 MED ORDER — HYDROXYZINE HCL 10 MG PO TABS: 10.0000 mg | ORAL_TABLET | Freq: Three times a day (TID) | ORAL | 1 refills | Status: DC | PRN

## 2020-07-19 NOTE — Addendum Note (Signed)
Addended by: Aura Dials T on: 07/19/2020 09:08 AM   Modules accepted: Orders

## 2020-07-19 NOTE — Assessment & Plan Note (Signed)
Acute for weeks.  ?nerve related.  No rashes noted.  Will trial Gabapentin 300 MG BID + Vistaril PRN and Triamcinolone cream, scripts sent.  Recommend try avoid scratching areas to prevent further bruising and skin breakdown + ensure to moisturize skin daily.  Continue oatmeal baths.  Referral to dermatology placed per patient request.  Return to office as needed or if worsening.

## 2020-07-19 NOTE — Patient Instructions (Signed)
Pruritus Pruritus is an itchy feeling on the skin. One of the most common causes is dry skin, but many different things can cause itching. Most cases of itching do not require medical attention. Sometimes itchy skin can turn into a rash. Follow these instructions at home: Skin care   Apply moisturizing lotion to your skin as needed. Lotion that contains petroleum jelly is best.  Take medicines or apply medicated creams only as told by your health care provider. This may include: ? Corticosteroid cream. ? Anti-itch lotions. ? Oral antihistamines.  Apply a cool, wet cloth (cool compress) to the affected areas.  Take baths with one of the following: ? Epsom salts. You can get these at your local pharmacy or grocery store. Follow the instructions on the packaging. ? Baking soda. Pour a small amount into the bath as told by your health care provider. ? Colloidal oatmeal. You can get this at your local pharmacy or grocery store. Follow the instructions on the packaging.  Apply baking soda paste to your skin. To make the paste, stir water into a small amount of baking soda until it reaches a paste-like consistency.  Do not scratch your skin.  Do not take hot showers or baths, which can make itching worse. A cool shower may help with itching as long as you apply moisturizing lotion after the shower.  Do not use scented soaps, detergents, perfumes, and cosmetic products. Instead, use gentle, unscented versions of these items. General instructions  Avoid wearing tight clothes.  Keep a journal to help find out what is causing your itching. Write down: ? What you eat and drink. ? What cosmetic products you use. ? What soaps or detergents you use. ? What you wear, including jewelry.  Use a humidifier. This keeps the air moist, which helps to prevent dry skin.  Be aware of any changes in your itchiness. Contact a health care provider if:  The itching does not go away after several  days.  You are unusually thirsty or urinating more than normal.  Your skin tingles or feels numb.  Your skin or the white parts of your eyes turn yellow (jaundice).  You feel weak.  You have any of the following: ? Night sweats. ? Tiredness (fatigue). ? Weight loss. ? Abdominal pain. Summary  Pruritus is an itchy feeling on the skin. One of the most common causes is dry skin, but many different conditions and factors can cause itching.  Apply moisturizing lotion to your skin as needed. Lotion that contains petroleum jelly is best.  Take medicines or apply medicated creams only as told by your health care provider.  Do not take hot showers or baths. Do not use scented soaps, detergents, perfumes, or cosmetic products. This information is not intended to replace advice given to you by your health care provider. Make sure you discuss any questions you have with your health care provider. Document Revised: 10/05/2017 Document Reviewed: 10/05/2017 Elsevier Patient Education  2020 [employer] Inc.  

## 2020-07-19 NOTE — Progress Notes (Signed)
Ht 5\' 9"  (1.753 m)   Wt (!) 325 lb (147.4 kg)   BMI 47.99 kg/m    Subjective:    Patient ID: , female    DOB: 02-28-1981, 39 y.o.   MRN: [id number]  HPI: Rachel Cooley is a 39 y.o. female  Chief Complaint  Patient presents with  . Pruritus    Patient C/O itching on arms and trunk x several weeks. Patient denies any rash, or exposure new lotions, detergents, or new pets. Patient reports taking OTC Benadryl and reports mild itching control.    PRURITUS Reports ongoing for several weeks with no rash present -- 4-5 weeks.  Has been dealing with issues with her dog for months (May), has topical yeast infection.  She has used tea tree and antifungal topical creams.  Is currently using oatmeal body wash.  Benadryl at night and Zyrtec in the morning, not helping.  Is scratching so much she is bruising herself.  Night time is worse.  She would like to see dermatology and spoke to [facility] who has openings next week.  Currently not breastfeeding. Duration:  weeks  Location: bilateral arms, trunk, and legs Fevers: no Change in detergents/soaps/personal care products: no Recent illness: no Recent travel:no History of same: no Context: fluctuating Alleviating factors: nothing Treatments attempted:benadryl and lotion/moisturizer Shortness of breath: no  Throat/tongue swelling: no Myalgias/arthralgias: no  Relevant past medical, surgical, family and social history reviewed and updated as indicated. Interim medical history since our last visit reviewed. Allergies and medications reviewed and updated.  Review of Systems  Constitutional: Negative for activity change, appetite change, diaphoresis, fatigue and fever.  Respiratory: Negative for cough, chest tightness and shortness of breath.   Cardiovascular: Negative for chest pain, palpitations and leg swelling.  Gastrointestinal: Negative.   Neurological: Negative.   Psychiatric/Behavioral: Negative.      Per HPI unless specifically indicated above     Objective:    Ht 5\' 9"  (1.753 m)   Wt (!) 325 lb (147.4 kg)   BMI 47.99 kg/m   Wt Readings from Last 3 Encounters:  07/19/20 (!) 325 lb (147.4 kg)  07/09/20 (!) 327 lb (148.3 kg)  06/24/20 (!) 326 lb (147.9 kg)    Physical Exam Vitals and nursing note reviewed.  Constitutional:      General: She is awake. She is not in acute distress.    Appearance: She is well-developed. She is not ill-appearing.  HENT:     Head: Normocephalic.     Right Ear: Hearing normal.     Left Ear: Hearing normal.  Eyes:     General: Lids are normal.        Right eye: No discharge.        Left eye: No discharge.     Conjunctiva/sclera: Conjunctivae normal.  Pulmonary:     Effort: Pulmonary effort is normal. No accessory muscle usage or respiratory distress.  Musculoskeletal:     Cervical back: Normal range of motion.  Skin:    Comments: No rashes present.  Bilateral upper and lower extremities with patchy areas of bruising and linear scratch marks around bruising.    Neurological:     Mental Status: She is alert and oriented to person, place, and time.  Psychiatric:        Attention and Perception: Attention normal.        Mood and Affect: Mood normal.        Behavior: Behavior normal. Behavior is cooperative.  Thought Content: Thought content normal.        Judgment: Judgment normal.     Results for orders placed or performed in visit on 05/09/20  Insulin, random  Result Value Ref Range   INSULIN 16.7 2.6 - 24.9 uIU/mL  Hemoglobin A1c  Result Value Ref Range   Hgb A1c MFr Bld 5.6 4.8 - 5.6 %   Est. average glucose Bld gHb Est-mCnc 114 mg/dL  Lipid Panel With LDL/HDL Ratio  Result Value Ref Range   Cholesterol, Total 257 (H) 100 - 199 mg/dL   Triglycerides 951 (H) 0 - 149 mg/dL   HDL 41 >88 mg/dL   VLDL Cholesterol Cal 33 5 - 40 mg/dL   LDL Chol Calc (NIH) 416 (H) 0 - 99 mg/dL   LDL/HDL Ratio 4.5 (H) 0.0 - 3.2 ratio   VITAMIN D 25 Hydroxy (Vit-D Deficiency, Fractures)  Result Value Ref Range   Vit D, 25-Hydroxy 39.2 30.0 - 100.0 ng/mL      Assessment & Plan:   Problem List Items Addressed This Visit      Musculoskeletal and Integument   Pruritus - Primary    Acute for weeks.  ?nerve related.  No rashes noted.  Will trial Gabapentin 300 MG BID + Vistaril PRN and Triamcinolone cream, scripts sent.  Recommend try avoid scratching areas to prevent further bruising and skin breakdown + ensure to moisturize skin daily.  Continue oatmeal baths.  Referral to dermatology placed per patient request.  Return to office as needed or if worsening.      Relevant Orders   Ambulatory referral to Dermatology       Follow up plan: Return if symptoms worsen or fail to improve.

## 2020-07-23 DIAGNOSIS — R21 Rash and other nonspecific skin eruption: Secondary | ICD-10-CM | POA: Diagnosis not present

## 2020-07-25 MED ORDER — HYDROXYZINE HCL 10 MG PO TABS
10.0000 mg | ORAL_TABLET | Freq: Three times a day (TID) | ORAL | 1 refills | Status: DC | PRN
Start: 2020-07-25 — End: 2021-01-31

## 2020-07-25 MED ORDER — TRIAMCINOLONE ACETONIDE 0.1 % EX CREA
1.0000 | TOPICAL_CREAM | Freq: Two times a day (BID) | CUTANEOUS | 0 refills | Status: DC
Start: 2020-07-25 — End: 2021-01-31

## 2020-07-25 MED ORDER — GABAPENTIN 300 MG PO CAPS
300.0000 mg | ORAL_CAPSULE | Freq: Two times a day (BID) | ORAL | 2 refills | Status: DC
Start: 2020-07-25 — End: 2021-01-31

## 2020-07-30 ENCOUNTER — Ambulatory Visit (INDEPENDENT_AMBULATORY_CARE_PROVIDER_SITE_OTHER): Payer: Medicaid Other | Admitting: Family Medicine

## 2020-08-02 ENCOUNTER — Other Ambulatory Visit (INDEPENDENT_AMBULATORY_CARE_PROVIDER_SITE_OTHER): Payer: Self-pay | Admitting: Family Medicine

## 2020-08-02 DIAGNOSIS — E559 Vitamin D deficiency, unspecified: Secondary | ICD-10-CM

## 2020-08-03 ENCOUNTER — Other Ambulatory Visit: Payer: Self-pay | Admitting: Nurse Practitioner

## 2020-08-03 NOTE — Telephone Encounter (Signed)
Requested Prescriptions  Pending Prescriptions Disp Refills  . omeprazole (PRILOSEC) 40 MG capsule [Pharmacy Med Name: OMEPRAZOLE DR 40 MG CAPSULE] 90 capsule 0    Sig: TAKE 1 CAPSULE BY MOUTH EVERY DAY     Gastroenterology: Proton Pump Inhibitors Passed - 08/03/2020  4:17 PM      Passed - Valid encounter within last 12 months    Recent Outpatient Visits          2 weeks ago Pruritus   [facility] [place], Jolene T, NP   7 months ago Mild episode of recurrent major depressive disorder (HCC)   [facility] [place], Alb[place] T, NP   1 year ago Encounter for annual physical exam   [facility] [place], Corrie Dandy T, NP   1 year ago Mild episode of recurrent major depressive disorder (HCC)   [facility] [place], Jolene T, NP             . venlafaxine XR (EFFEXOR-XR) 75 MG 24 hr capsule [Pharmacy Med Name: VENLAFAXINE HCL ER 75 MG CAP] 180 capsule     Sig: TAKE 1 CAPSULE BY MOUTH TWICE A DAY     Psychiatry: Antidepressants - SNRI - desvenlafaxine & venlafaxine Failed - 08/03/2020  4:17 PM      Failed - LDL in normal range and within 360 days    LDL Chol Calc (NIH)  Date Value Ref Range Status  05/09/2020 183 (H) 0 - 99 mg/dL Final         Failed - Total Cholesterol in normal range and within 360 days    Cholesterol, Total  Date Value Ref Range Status  05/09/2020 257 (H) 100 - 199 mg/dL Final         Failed - Triglycerides in normal range and within 360 days    Triglycerides  Date Value Ref Range Status  05/09/2020 177 (H) 0 - 149 mg/dL Final         Passed - Completed PHQ-2 or PHQ-9 in the last 360 days      Passed - Last BP in normal range    BP Readings from Last 1 Encounters:  07/09/20 130/81         Passed - Valid encounter within last 6 months    Recent Outpatient Visits          2 weeks ago Pruritus   [facility] [place], Jolene T, NP   7 months ago Mild episode of recurrent major depressive disorder  (HCC)   [facility] [place], Dorie Rank, NP   1 year ago Encounter for annual physical exam   [facility] [place], Jolene T, NP   1 year ago Mild episode of recurrent major depressive disorder (HCC)   [facility] [place], Dorie Rank, NP

## 2020-08-05 ENCOUNTER — Telehealth: Payer: Self-pay

## 2020-08-05 NOTE — Telephone Encounter (Signed)
Copied from CRM [id number]. Topic: General - Other >> Aug 05, 2020  9:16 AM Jaquita Rector A wrote: Reason for CRM: Patient called in Rx for venlafaxine XR (EFFEXOR-XR) 75 MG 24 hr capsule, and buPROPion (WELLBUTRIN XL) 150 MG 24 hr tablet not received by the pharmacy. Per snap shot neither Rx went through to the pharmacy please resend

## 2020-08-05 NOTE — Telephone Encounter (Signed)
This patient was last seen by Dr. Lawson Radar, and currently has an upcoming appt scheduled on 08/15/20 with her.

## 2020-08-06 ENCOUNTER — Other Ambulatory Visit: Payer: Self-pay

## 2020-08-06 MED ORDER — VENLAFAXINE HCL ER 75 MG PO CP24: ORAL_CAPSULE | ORAL | 4 refills | Status: DC

## 2020-08-06 MED ORDER — BUPROPION HCL ER (XL) 150 MG PO TB24: 150.0000 mg | ORAL_TABLET | Freq: Every day | ORAL | 4 refills | Status: DC

## 2020-08-06 NOTE — Telephone Encounter (Signed)
Called pt told her that I sent a RF for both medications. Pt verbalized understanding.  KP

## 2020-08-06 NOTE — Telephone Encounter (Signed)
PT calling back to F/UP  

## 2020-08-15 ENCOUNTER — Ambulatory Visit (INDEPENDENT_AMBULATORY_CARE_PROVIDER_SITE_OTHER): Payer: Medicaid Other | Admitting: Family Medicine

## 2020-08-19 ENCOUNTER — Other Ambulatory Visit: Payer: Self-pay | Admitting: Nurse Practitioner

## 2020-08-19 MED ORDER — VITAMIN D3 50 MCG (2000 UT) PO TABS
2000.0000 [IU] | ORAL_TABLET | Freq: Every day | ORAL | 4 refills | Status: DC
Start: 2020-08-19 — End: 2020-08-29

## 2020-08-29 ENCOUNTER — Other Ambulatory Visit: Payer: Self-pay | Admitting: Nurse Practitioner

## 2020-08-29 MED ORDER — CHOLECALCIFEROL 1.25 MG (50000 UT) PO TABS: 1.0000 | ORAL_TABLET | ORAL | 4 refills | Status: DC

## 2020-09-09 ENCOUNTER — Other Ambulatory Visit: Payer: Self-pay | Admitting: Nurse Practitioner

## 2020-09-09 DIAGNOSIS — E559 Vitamin D deficiency, unspecified: Secondary | ICD-10-CM

## 2020-09-09 MED ORDER — VITAMIN D (ERGOCALCIFEROL) 1.25 MG (50000 UNIT) PO CAPS: 50000.0000 [IU] | ORAL_CAPSULE | ORAL | 4 refills | Status: DC

## 2020-09-09 NOTE — Progress Notes (Signed)
Error

## 2020-09-09 NOTE — Telephone Encounter (Signed)
It looks like this is what you ordered.

## 2020-09-14 ENCOUNTER — Other Ambulatory Visit: Payer: Self-pay | Admitting: Nurse Practitioner

## 2020-09-18 DIAGNOSIS — B86 Scabies: Secondary | ICD-10-CM | POA: Diagnosis not present

## 2020-10-29 ENCOUNTER — Other Ambulatory Visit: Payer: Self-pay

## 2020-10-30 MED ORDER — OMEPRAZOLE 40 MG PO CPDR: 40.0000 mg | DELAYED_RELEASE_CAPSULE | Freq: Every day | ORAL | 0 refills | Status: DC

## 2020-10-30 NOTE — Telephone Encounter (Signed)
Last routine visit was in March, advised to follow up in September. Please schedule patient an appointment, routing for refill as well.

## 2020-11-05 ENCOUNTER — Encounter: Payer: Self-pay | Admitting: Nurse Practitioner

## 2020-11-05 ENCOUNTER — Other Ambulatory Visit: Payer: Self-pay | Admitting: Nurse Practitioner

## 2020-11-05 ENCOUNTER — Telehealth (INDEPENDENT_AMBULATORY_CARE_PROVIDER_SITE_OTHER): Payer: Medicaid Other | Admitting: Nurse Practitioner

## 2020-11-05 DIAGNOSIS — U071 COVID-19: Secondary | ICD-10-CM

## 2020-11-05 DIAGNOSIS — Z8616 Personal history of COVID-19: Secondary | ICD-10-CM | POA: Insufficient documentation

## 2020-11-05 MED ORDER — CICLOPIROX 8 % EX SOLN: Freq: Every day | CUTANEOUS | 0 refills | Status: DC

## 2020-11-05 NOTE — Progress Notes (Signed)
There were no vitals taken for this visit.   Subjective:    Patient ID: Rachel Cooley, female    DOB: 1981-04-11, 40 y.o.   MRN: [id number]  HPI: Rachel Cooley is a 40 y.o. female  Chief Complaint  Patient presents with  . Covid Positive    Positive result from home test yesterday. Sore throat, headache, fever, body aches, feeling hot and sweaty, things seem to be improving since morning.    . This visit was completed via MyChart due to the restrictions of the COVID-19 pandemic. All issues as above were discussed and addressed. Physical exam was done as above through visual confirmation on MyChart. If it was felt that the patient should be evaluated in the office, they were directed there. The patient verbally consented to this visit. . Location of the patient: home . Location of the provider: work . Those involved with this call:  . Provider: Aura Dials, DNP . CMA: Tristan Schroeder, CMA . Front Desk/Registration: Harriet Pho  . Time spent on call: 21 minutes with patient face to face via video conference. More than 50% of this time was spent in counseling and coordination of care. 15 minutes total spent in review of patient's record and preparation of their chart.  . I verified patient identity using two factors (patient name and date of birth). Patient consents verbally to being seen via telemedicine visit today.    COVID POSITIVE Symptoms started 3 days ago with tickle in throat, Sunday started with cough and body aches, headache.  Tested on home test at positive.  Is not vaccinated.  Denies loss of taste or smell.  She feels at this point symptoms are improving. Fever: low grade this morning 100.1 Cough: yes Shortness of breath: no Wheezing: no Chest pain: no Chest tightness: no Chest congestion: no Nasal congestion: yes Runny nose: yes Post nasal drip: yes Sneezing: no Sore throat: yes Swollen glands: no Sinus pressure: no Headache: yes Face pain:  no Toothache: no Ear pain: none Ear pressure: none Eyes red/itching:no Eye drainage/crusting: no  Vomiting: nauseas yesterday Rash: no Fatigue: yes Sick contacts: no Strep contacts: no  Context: stable Recurrent sinusitis: no Relief with OTC cold/cough medications: yes  Treatments attempted: Tylenol and Ibuprofen, Zinc, and multivitamin   Relevant past medical, surgical, family and social history reviewed and updated as indicated. Interim medical history since our last visit reviewed. Allergies and medications reviewed and updated.  Review of Systems  Constitutional: Positive for fatigue and fever. Negative for activity change and appetite change.  HENT: Positive for congestion, postnasal drip and rhinorrhea. Negative for ear discharge, ear pain, facial swelling, sinus pressure, sinus pain, sneezing, sore throat and voice change.   Eyes: Negative for pain and visual disturbance.  Respiratory: Positive for cough. Negative for chest tightness, shortness of breath and wheezing.   Cardiovascular: Negative for chest pain, palpitations and leg swelling.  Gastrointestinal: Negative.   Musculoskeletal: Positive for myalgias.  Neurological: Positive for headaches. Negative for dizziness and numbness.  Psychiatric/Behavioral: Negative.     Per HPI unless specifically indicated above     Objective:    There were no vitals taken for this visit.  Wt Readings from Last 3 Encounters:  07/19/20 (!) 325 lb (147.4 kg)  07/09/20 (!) 327 lb (148.3 kg)  06/24/20 (!) 326 lb (147.9 kg)    Physical Exam Vitals and nursing note reviewed.  Constitutional:      General: She is awake. She is not in acute  distress.    Appearance: She is well-developed and well-groomed. She is obese. She is not ill-appearing or toxic-appearing.  HENT:     Head: Normocephalic.     Right Ear: Hearing normal.     Left Ear: Hearing normal.  Eyes:     General: Lids are normal.        Right eye: No discharge.         Left eye: No discharge.     Conjunctiva/sclera: Conjunctivae normal.  Pulmonary:     Effort: Pulmonary effort is normal. No accessory muscle usage or respiratory distress.     Comments: Unable to auscultate due to virtual exam only.  No SOB with talking. Musculoskeletal:     Cervical back: Normal range of motion.  Neurological:     Mental Status: She is alert and oriented to person, place, and time.  Psychiatric:        Attention and Perception: Attention normal.        Mood and Affect: Mood normal.        Behavior: Behavior normal. Behavior is cooperative.        Thought Content: Thought content normal.        Judgment: Judgment normal.     Results for orders placed or performed in visit on 05/09/20  Insulin, random  Result Value Ref Range   INSULIN 16.7 2.6 - 24.9 uIU/mL  Hemoglobin A1c  Result Value Ref Range   Hgb A1c MFr Bld 5.6 4.8 - 5.6 %   Est. average glucose Bld gHb Est-mCnc 114 mg/dL  Lipid Panel With LDL/HDL Ratio  Result Value Ref Range   Cholesterol, Total 257 (H) 100 - 199 mg/dL   Triglycerides 606 (H) 0 - 149 mg/dL   HDL 41 >30 mg/dL   VLDL Cholesterol Cal 33 5 - 40 mg/dL   LDL Chol Calc (NIH) 160 (H) 0 - 99 mg/dL   LDL/HDL Ratio 4.5 (H) 0.0 - 3.2 ratio  VITAMIN D 25 Hydroxy (Vit-D Deficiency, Fractures)  Result Value Ref Range   Vit D, 25-Hydroxy 39.2 30.0 - 100.0 ng/mL      Assessment & Plan:   Problem List Items Addressed This Visit      Other   Lab test positive for detection of COVID-19 virus    Symptoms starting 3 days ago, acute.  Tested positive on home test.  Non vaccinated.  She feels symptoms are improving.  Discussed outpatient treatment options through [facility], such as MAB or oral treatments.  She is not interested in referral for these at this time, as feels her symptoms are improving.  Discussed with her timeline for these in regard to treatment, she is aware if any worsening symptoms to alert provider immediately.  Recommend starting Vitamin  C 500 MG twice a day, Quercetin 250-500 MG twice a day, Zinc 75-100 MG daily, and Vitamin D3 2000-4000 units daily to help.  You may also use over the counter cold medications for symptom relief.  Ensure good hydration and rest.  Tylenol for fever if presents.  Stay quarantined for at least 10 days.  Return to office for any worsening or ongoing symptoms.         I discussed the assessment and treatment plan with the patient. The patient was provided an opportunity to ask questions and all were answered. The patient agreed with the plan and demonstrated an understanding of the instructions.   The patient was advised to call back or seek an in-person evaluation if the  symptoms worsen or if the condition fails to improve as anticipated.   I provided 21+ minutes of time during this encounter.  Follow up plan: Return if symptoms worsen or fail to improve.

## 2020-11-05 NOTE — Assessment & Plan Note (Signed)
Symptoms starting 3 days ago, acute.  Tested positive on home test.  Non vaccinated.  She feels symptoms are improving.  Discussed outpatient treatment options through [facility], such as MAB or oral treatments.  She is not interested in referral for these at this time, as feels her symptoms are improving.  Discussed with her timeline for these in regard to treatment, she is aware if any worsening symptoms to alert provider immediately.  Recommend starting Vitamin C 500 MG twice a day, Quercetin 250-500 MG twice a day, Zinc 75-100 MG daily, and Vitamin D3 2000-4000 units daily to help.  You may also use over the counter cold medications for symptom relief.  Ensure good hydration and rest.  Tylenol for fever if presents.  Stay quarantined for at least 10 days.  Return to office for any worsening or ongoing symptoms.

## 2020-11-05 NOTE — Patient Instructions (Signed)

## 2020-11-06 ENCOUNTER — Telehealth: Payer: Medicaid Other | Admitting: Nurse Practitioner

## 2020-11-08 ENCOUNTER — Other Ambulatory Visit: Payer: Self-pay | Admitting: Nurse Practitioner

## 2020-11-08 MED ORDER — ALBUTEROL SULFATE HFA 108 (90 BASE) MCG/ACT IN AERS: 2.0000 | INHALATION_SPRAY | Freq: Four times a day (QID) | RESPIRATORY_TRACT | 0 refills | Status: DC | PRN

## 2020-11-08 MED ORDER — PREDNISONE 20 MG PO TABS: 40.0000 mg | ORAL_TABLET | Freq: Every day | ORAL | 0 refills | Status: AC

## 2020-12-23 ENCOUNTER — Other Ambulatory Visit: Payer: Self-pay | Admitting: Nurse Practitioner

## 2021-01-14 ENCOUNTER — Ambulatory Visit: Payer: Medicaid Other | Admitting: Nurse Practitioner

## 2021-01-30 ENCOUNTER — Other Ambulatory Visit: Payer: Self-pay | Admitting: Nurse Practitioner

## 2021-01-31 ENCOUNTER — Encounter: Payer: Self-pay | Admitting: Nurse Practitioner

## 2021-01-31 ENCOUNTER — Ambulatory Visit: Payer: Medicaid Other | Admitting: Nurse Practitioner

## 2021-01-31 ENCOUNTER — Other Ambulatory Visit: Payer: Self-pay

## 2021-01-31 VITALS — BP 139/85 | HR 92 | Temp 98.2°F | Wt 341.6 lb

## 2021-01-31 DIAGNOSIS — F330 Major depressive disorder, recurrent, mild: Secondary | ICD-10-CM | POA: Diagnosis not present

## 2021-01-31 DIAGNOSIS — Z6841 Body Mass Index (BMI) 40.0 and over, adult: Secondary | ICD-10-CM

## 2021-01-31 DIAGNOSIS — F411 Generalized anxiety disorder: Secondary | ICD-10-CM | POA: Diagnosis not present

## 2021-01-31 MED ORDER — BUPROPION HCL ER (XL) 300 MG PO TB24: 300.0000 mg | ORAL_TABLET | Freq: Every day | ORAL | 4 refills | Status: DC

## 2021-01-31 NOTE — Assessment & Plan Note (Signed)
BMI 50.45, has been working with weight management without much success and would like referral to [facility] or [facility] to discuss weight loss surgery.  Referral placed today.  Recommended eating smaller high protein, low fat meals more frequently and exercising 30 mins a day 5 times a week with a goal of 10-15lb weight loss in the next 3 months. Patient voiced their understanding and motivation to adhere to these recommendations.

## 2021-01-31 NOTE — Assessment & Plan Note (Signed)
Chronic, ongoing, exacerbated at this time.  Denies SI/HI.  Will increase Wellbutrin to 300 MG XL daily.  Continue Effexor XR 75 MG BID and Buspar 5 MG BID as needed.  Referral placed to start therapy in virtual environment, which would provide some coping techniques and self care time.  Discussed at length with her.  Check TSH and CMP today.  Plan for return in 4 weeks and will adjust regimen further as needed.
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
## 2021-01-31 NOTE — Assessment & Plan Note (Signed)
Chronic, ongoing, exacerbated at this time.  Denies SI/HI.  Will increase Wellbutrin to 300 MG XL daily.  Continue Effexor XR 75 MG BID and Buspar 5 MG BID as needed.  Referral placed to start therapy in virtual environment, which would provide some coping techniques and self care time.  Discussed at length with her.  Plan for return in 4 weeks and will adjust regimen further as needed.

## 2021-01-31 NOTE — Patient Instructions (Signed)

## 2021-02-01 LAB — COMPREHENSIVE METABOLIC PANEL
ALT: 18 IU/L (ref 0–32)
AST: 18 IU/L (ref 0–40)
Albumin/Globulin Ratio: 1.4 (ref 1.2–2.2)
Albumin: 4.2 g/dL (ref 3.8–4.8)
Alkaline Phosphatase: 80 IU/L (ref 44–121)
BUN/Creatinine Ratio: 29 — ABNORMAL HIGH (ref 9–23)
BUN: 18 mg/dL (ref 6–20)
Bilirubin Total: <0.2 mg/dL (ref 0.0–1.2)
CO2: 22 mmol/L (ref 20–29)
Calcium: 9 mg/dL (ref 8.7–10.2)
Chloride: 100 mmol/L (ref 96–106)
Creatinine, Ser: 0.62 mg/dL (ref 0.57–1.00)
Globulin, Total: 2.9 g/dL (ref 1.5–4.5)
Glucose: 89 mg/dL (ref 65–99)
Potassium: 4.1 mmol/L (ref 3.5–5.2)
Sodium: 141 mmol/L (ref 134–144)
Total Protein: 7.1 g/dL (ref 6.0–8.5)
eGFR: 117 mL/min/{1.73_m2} (ref >59)

## 2021-02-01 LAB — TSH: TSH: 2.04 u[IU]/mL (ref 0.450–4.500)

## 2021-02-01 NOTE — Progress Notes (Signed)
Contacted via MyChart   Good morning Rachel Cooley, your labs have returned and all looks great, I have no concerns on these.  Hope you are having an amazing day!!  Take a big breath and give yourself a hug.  You have got this and I am here for you.:) Keep being awesome!!  Thank you for allowing me to participate in your care. Kindest regards, Kaylor Maiers

## 2021-02-28 ENCOUNTER — Encounter: Payer: Self-pay | Admitting: Nurse Practitioner

## 2021-02-28 ENCOUNTER — Ambulatory Visit: Payer: Medicaid Other | Admitting: Nurse Practitioner

## 2021-02-28 ENCOUNTER — Ambulatory Visit (INDEPENDENT_AMBULATORY_CARE_PROVIDER_SITE_OTHER): Payer: Medicaid Other | Admitting: Nurse Practitioner

## 2021-02-28 DIAGNOSIS — F330 Major depressive disorder, recurrent, mild: Secondary | ICD-10-CM

## 2021-02-28 DIAGNOSIS — F411 Generalized anxiety disorder: Secondary | ICD-10-CM

## 2021-02-28 DIAGNOSIS — Z6841 Body Mass Index (BMI) 40.0 and over, adult: Secondary | ICD-10-CM

## 2021-02-28 NOTE — Patient Instructions (Signed)

## 2021-02-28 NOTE — Assessment & Plan Note (Signed)
BMI 50.45 last visit, has been working with weight management without much success and new referral placed last visit to [facility], she is awaiting to perform paperwork.   Recommended eating smaller high protein, low fat meals more frequently and exercising 30 mins a day 5 times a week with a goal of 10-15lb weight loss in the next 3 months. Patient voiced their understanding and motivation to adhere to these recommendations.

## 2021-02-28 NOTE — Assessment & Plan Note (Signed)
Chronic, stable with improvement.  Denies SI/HI.  Continue Wellbutrin 300 MG XL daily, Effexor XR 75 MG BID and Buspar 5 MG BID as needed.  Referral placed to start therapy in virtual environment last visit, which would provide some coping techniques and self care time.  She has not heard from them.  Discussed at length with her.  Plan for return in September for physical. 

## 2021-02-28 NOTE — Assessment & Plan Note (Signed)
Chronic, stable with improvement.  Denies SI/HI.  Continue Wellbutrin 300 MG XL daily, Effexor XR 75 MG BID and Buspar 5 MG BID as needed.  Referral placed to start therapy in virtual environment last visit, which would provide some coping techniques and self care time.  She has not heard from them.  Discussed at length with her.  Plan for return in September for physical.
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
## 2021-03-10 ENCOUNTER — Telehealth: Payer: Self-pay

## 2021-03-10 NOTE — Telephone Encounter (Signed)
-----   Message from Marjie Skiff, NP sent at 02/28/2021 11:06 AM EDT ----- Annual physical due after 06/22/21 -- please schedule

## 2021-03-10 NOTE — Telephone Encounter (Signed)
Pt stated she would call back to make this apt. 

## 2021-03-17 DIAGNOSIS — E785 Hyperlipidemia, unspecified: Secondary | ICD-10-CM | POA: Diagnosis not present

## 2021-03-17 DIAGNOSIS — Z6841 Body Mass Index (BMI) 40.0 and over, adult: Secondary | ICD-10-CM | POA: Diagnosis not present

## 2021-03-17 DIAGNOSIS — F32A Depression, unspecified: Secondary | ICD-10-CM | POA: Insufficient documentation

## 2021-03-17 DIAGNOSIS — Z9884 Bariatric surgery status: Secondary | ICD-10-CM | POA: Diagnosis not present

## 2021-04-02 ENCOUNTER — Other Ambulatory Visit: Payer: Self-pay | Admitting: Nurse Practitioner

## 2021-04-24 DIAGNOSIS — Z6841 Body Mass Index (BMI) 40.0 and over, adult: Secondary | ICD-10-CM | POA: Diagnosis not present

## 2021-04-24 HISTORY — DX: Morbid (severe) obesity due to excess calories: E66.01

## 2021-04-24 HISTORY — DX: Body Mass Index (BMI) 40.0 and over, adult: Z684

## 2021-04-25 ENCOUNTER — Other Ambulatory Visit: Payer: Self-pay | Admitting: Nurse Practitioner

## 2021-04-25 NOTE — Telephone Encounter (Signed)
Requested medication (s) are due for refill today:   Yes  Requested medication (s) are on the active medication list:   Yes  Future visit scheduled:   No   Seen a month ago   Last ordered: 04/02/2021 #60, 2 refills  Returned because pharmacy requesting a 90 day supply   Requested Prescriptions  Pending Prescriptions Disp Refills   busPIRone (BUSPAR) 5 MG tablet [Pharmacy Med Name: BUSPIRONE HCL 5 MG TABLET] 180 tablet 1    Sig: TAKE 1 TABLET (5 MG TOTAL) BY MOUTH 2 (TWO) TIMES DAILY AS NEEDED (FOR ANXIETY).      Psychiatry: Anxiolytics/Hypnotics - Non-controlled Passed - 04/25/2021  8:31 AM      Passed - Valid encounter within last 6 months    Recent Outpatient Visits           1 month ago Mild episode of recurrent major depressive disorder (HCC)   [facility] Cann[place], Jolene T, NP   2 months ago Mild episode of recurrent major depressive disorder (HCC)   [facility] Cannady, Jolene T, NP   5 months ago Lab test positive for detection of COVID-19 virus   [facility], Dorie Rank, NP   9 months ago Pruritus   [facility] [place], Mary Esther T, NP   1 year ago Mild episode of recurrent major depressive disorder (HCC)   [facility] [place], Dorie Rank, NP

## 2021-04-29 NOTE — Telephone Encounter (Signed)
PLEASE ADVISE.

## 2021-05-04 ENCOUNTER — Other Ambulatory Visit: Payer: Self-pay | Admitting: Nurse Practitioner

## 2021-05-04 NOTE — Telephone Encounter (Signed)
Requested Prescriptions  Pending Prescriptions Disp Refills  . omeprazole (PRILOSEC) 40 MG capsule [Pharmacy Med Name: OMEPRAZOLE DR 40 MG CAPSULE] 90 capsule 0    Sig: TAKE 1 CAPSULE (40 MG TOTAL) BY MOUTH DAILY.     Gastroenterology: Proton Pump Inhibitors Passed - 05/04/2021  6:44 AM      Passed - Valid encounter within last 12 months    Recent Outpatient Visits          2 months ago Mild episode of recurrent major depressive disorder (HCC)   [facility] Cannady, Jolene T, NP   3 months ago Mild episode of recurrent major depressive disorder (HCC)   [facility] Cannady, Jolene T, NP   6 months ago Lab test positive for detection of COVID-19 virus   [facility], Dorie Rank, NP   9 months ago Pruritus   [facility] [place], [place] T, NP   1 year ago Mild episode of recurrent major depressive disorder (HCC)   [facility] [place], Dorie Rank, NP

## 2021-05-12 ENCOUNTER — Other Ambulatory Visit: Payer: Self-pay

## 2021-05-12 ENCOUNTER — Ambulatory Visit (INDEPENDENT_AMBULATORY_CARE_PROVIDER_SITE_OTHER): Payer: Medicaid Other | Admitting: Internal Medicine

## 2021-05-12 VITALS — BP 136/97 | HR 97 | Resp 18 | Ht 69.0 in | Wt 351.3 lb

## 2021-05-12 DIAGNOSIS — K219 Gastro-esophageal reflux disease without esophagitis: Secondary | ICD-10-CM

## 2021-05-12 DIAGNOSIS — G4733 Obstructive sleep apnea (adult) (pediatric): Secondary | ICD-10-CM | POA: Diagnosis not present

## 2021-05-12 NOTE — Progress Notes (Signed)
Sleep Medicine   Office Visit  Patient Name: Rachel Cooley DOB: 09-10-1981 MRN [id number]    Chief Complaint: sleep evaluation prior to weight loss surgery  Brief History:  Selina presents with a  history of multifactorial obesity & GERD.  Sleep quality is poor and is noted most nights. The patient's bed partner reports  no gasping, observed apneas, nor any other disorder behavior.  at night. The patient relates the following symptoms: poor sleep, anxiety and depression are also present. The patient goes to sleep at 10 pm and wakes up at 6 am with only 2 bathroom stops at night..  Patient said initially takes 15 - 30 minutes to go to sleep, and said after bathroom trips, goes back to sleep immediately.   Sleep quality is unchanged a when outside home environment and denies any sleeping difficulties and said she wakes refreshed.  Patient has noted no restlessness of her legs at night.  The patient  relates no unusual behavior during the night.  The patient reports a history of psychiatric problems (depression, anxiety) The Epworth Sleepiness Score is 4 out of 24 .  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    ROS  General: (-) fever, (-) chills, (-) night sweat Nose and Sinuses: (-) nasal stuffiness or itchiness, (-) postnasal drip, (-) nosebleeds, (-) sinus trouble. Mouth and Throat: (-) sore throat, (-) hoarseness. Neck: (-) swollen glands, (-) enlarged thyroid, (-) neck pain. Respiratory: - cough, - shortness of breath, - wheezing. Neurologic: - numbness, - tingling. Psychiatric: + anxiety, + depression Sleep behavior: -sleep paralysis -hypnogogic hallucinations -dream enactment      -vivid dreams -cataplexy -night terrors -sleep walking   Current Medication: Outpatient  Encounter Medications as of 05/12/2021  Medication Sig   aspirin 81 MG chewable tablet Chew by mouth.   buPROPion (WELLBUTRIN XL) 300 MG 24 hr tablet Take 1 tablet (300 mg total) by mouth daily.   buPROPion (WELLBUTRIN XL) 300 MG 24 hr tablet Take 1 tablet by mouth every morning.   busPIRone (BUSPAR) 5 MG tablet TAKE 1 TABLET (5 MG TOTAL) BY MOUTH 2 (TWO) TIMES DAILY AS NEEDED (FOR ANXIETY).   cetirizine (ZYRTEC) 10 MG tablet Take by mouth.   docusate sodium (COLACE) 100 MG capsule Take 100 mg by mouth at bedtime.   Ferrous Gluconate 239 (27 Fe) MG TABS Take by mouth.   Multiple Vitamin (MULTIVITAMIN) tablet Take 1 tablet by mouth daily.   omeprazole (PRILOSEC) 40 MG capsule TAKE 1 CAPSULE (40 MG TOTAL) BY MOUTH DAILY.   omeprazole (PRILOSEC) 40 MG capsule Take 1 capsule by mouth every morning.   paragard intrauterine copper IUD IUD by Intrauterine route.   venlafaxine XR (EFFEXOR-XR) 75 MG 24 hr capsule Take 1 capsule (75 mg total) by mouth 2 (two) times a day.   Vitamin D, Ergocalciferol, (DRISDOL) 1.25 MG (50000 UNIT) CAPS capsule Take 1 capsule (50,000 Units total) by mouth every 7 (seven) days.   No facility-administered encounter medications on file as of 05/12/2021.    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    Obesity    Preeclampsia, severe, third trimester    Shortness of breath     Family History: Non contributory to the present illness  Social History: Social History   Socioeconomic History   Marital status: Single    Spouse name: Not on file   Number of  children: 2   Years of education: Not on file   Highest education level: Not on file  Occupational History   Not on file  Tobacco Use   Smoking status: Former    Types: Cigarettes   Smokeless tobacco: Never   Tobacco comments:    stopped with +UPT  Vaping Use   Vaping Use: Never used  Substance and Sexual Activity   Alcohol use: No   Drug use: No   Sexual activity: Yes    Partners: Male    Birth control/protection: Pill  Other Topics Concern   Not on file  Social History Narrative   Not on file   Social Determinants of Health   Financial Resource Strain: Not on file  Food Insecurity: Not on file  Transportation Needs: Not on file  Physical Activity: Not on file  Stress: Not on file  Social Connections: Not on file  Intimate Partner Violence: Not on file    Vital Signs: Blood pressure (!) 136/97, pulse 97, resp. rate 18, height 5\' 9"  (1.753 m), weight (!) 351 lb 4.8 oz (159.3 kg), SpO2 100 %, unknown if currently breastfeeding.  Examination: General Appearance: The patient is well-developed, well-nourished, and in no distress. Neck Circumference: 44 Skin: Gross inspection of skin unremarkable. Head: normocephalic, no gross deformities. Eyes: no gross deformities noted. ENT: ears appear grossly normal Neurologic: Alert and oriented. No involuntary movements.    EPWORTH SLEEPINESS SCALE:  Scale:  (0)= no chance of dozing; (1)= slight chance of dozing; (2)= moderate chance of dozing; (3)= high chance of dozing  Chance  Situtation    Sitting and reading: 0    Watching TV: 0    Sitting Inactive in public: 0    As a passenger in car: 0      Lying down to rest: 2    Sitting and talking: 0    Sitting quielty after lunch: 0    In a car, stopped in traffic: 0   TOTAL SCORE:   2 out of 24    SLEEP STUDIES:  No studies on file   LABS: No results found for this or any previous visit (from the past 2160 hour(s)).  Radiology: [facility] W/O  Cm  Result Date: 12/22/2014 * PRIOR REPORT IMPORTED FROM AN EXTERNAL SYSTEM * CLINICAL DATA:  Low back pain with slight numbness and tingling in both legs for 8 months. Weakness in both legs. EXAM: MRI LUMBAR SPINE WITHOUT CONTRAST TECHNIQUE: Multiplanar, multisequence MR imaging of the lumbar spine was performed. No intravenous contrast was administered. COMPARISON:  CT scan abdomen dated 11/09/2010 FINDINGS: Normal paraspinal soft tissues. T11-12 through L2-3:  Normal. L3-4:  Normal disc.  Slight hypertrophy of the facet joints. L4-5: 2.5 mm spondylolisthesis. The disc is normal. Moderately severe bilateral facet arthritis with bilateral joint effusions. L5-S1: The disc is normal.  Slight bilateral facet arthritis. IMPRESSION: 1. 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 2. Slight  facet arthritis at L3-4 and L5-S1. 3. No neural impingement.  No spinal or foraminal stenosis. Electronically Signed   By: Francene Boyers M.D.   On: 12/22/2014 14:32     No results found.  No results found.    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) 04/24/2021   Depression 03/17/2021   Hyperlipidemia 03/17/2021   History of 2019 novel coronavirus disease (COVID-19) 11/05/2020   Insulin resistance 04/03/2020   Vitamin D deficiency 04/03/2020   Class 3 severe obesity with serious comorbidity and body mass index (BMI) of 45.0 to 49.9 in adult (HCC) 06/23/2019   Mild episode of recurrent major depressive disorder (HCC) 04/28/2019   Generalized anxiety disorder 04/28/2019   GERD (gastroesophageal reflux disease) 04/28/2019   Chronic low back pain 05/07/2017   Spondylosis of lumbar region without myelopathy or radiculopathy  03/12/2015   1. OSA (obstructive sleep apnea) PLAN OSA:   Patient evaluation suggests high risk of sleep disordered breathing due to obesity, daytime somnolence and a mallampati II airway.  BMI is over 50 with a neck circumference of 44. She is a bariatric surgery candidate and they have referred her.  Suggest: PSG  to assess patient's sleep disordered breathing. The patient was also counselled on weight loss  to optimize sleep health.   2. Gastroesophageal reflux disease without esophagitis Stable and controlled with omeprazole.   3. Morbid obesity (HCC) Pursuing bariatric evaluation. Obesity Counseling: Had a lengthy discussion regarding patients BMI and weight issues. Patient was instructed on portion control as well as increased activity. Also discussed caloric restrictions with trying to maintain intake less than 2000 Kcal. Discussions were made in accordance with the 5As of weight management. Simple actions such as not eating late and if able to, taking a walk is suggested.      General Counseling: I have discussed the findings of the evaluation and examination with Emmalea.  I have also discussed any further diagnostic evaluation thatmay be needed or ordered today. Saloni verbalizes understanding of the findings of todays visit. We also reviewed her medications today and discussed drug interactions and side effects including but not limited excessive drowsiness and altered mental states. We also discussed that there is always a risk not just to her but also people around her. she has been encouraged to call the office with any questions or concerns that should arise related to todays visit.  No orders of the defined types were placed in this encounter.       I have personally obtained a history, evaluated the patient, evaluated pertinent data, formulated the assessment and plan and placed orders.  This patient was seen today by Emmaline Kluver, PA-C in collaboration with Dr. Freda Munro.     Yevonne Pax, MD [facility] Diplomate ABMS Pulmonary and Critical Care Medicine Sleep medicine

## 2021-05-15 DIAGNOSIS — E8881 Metabolic syndrome: Secondary | ICD-10-CM | POA: Diagnosis not present

## 2021-05-15 DIAGNOSIS — Z79899 Other long term (current) drug therapy: Secondary | ICD-10-CM | POA: Diagnosis not present

## 2021-05-15 DIAGNOSIS — E785 Hyperlipidemia, unspecified: Secondary | ICD-10-CM | POA: Diagnosis not present

## 2021-05-15 DIAGNOSIS — Z713 Dietary counseling and surveillance: Secondary | ICD-10-CM | POA: Diagnosis not present

## 2021-05-15 DIAGNOSIS — E119 Type 2 diabetes mellitus without complications: Secondary | ICD-10-CM | POA: Diagnosis not present

## 2021-05-15 DIAGNOSIS — Z7189 Other specified counseling: Secondary | ICD-10-CM | POA: Diagnosis not present

## 2021-05-15 DIAGNOSIS — K219 Gastro-esophageal reflux disease without esophagitis: Secondary | ICD-10-CM | POA: Diagnosis not present

## 2021-05-15 DIAGNOSIS — Z6841 Body Mass Index (BMI) 40.0 and over, adult: Secondary | ICD-10-CM | POA: Diagnosis not present

## 2021-05-20 ENCOUNTER — Emergency Department: Payer: Medicaid Other

## 2021-05-20 ENCOUNTER — Encounter: Payer: Self-pay | Admitting: Emergency Medicine

## 2021-05-20 ENCOUNTER — Other Ambulatory Visit: Payer: Self-pay

## 2021-05-20 ENCOUNTER — Emergency Department
Admission: EM | Admit: 2021-05-20 | Discharge: 2021-05-20 | Disposition: A | Discharge status: Home / Self Care | Payer: Medicaid Other | Attending: Emergency Medicine | Admitting: Emergency Medicine

## 2021-05-20 DIAGNOSIS — Z87891 Personal history of nicotine dependence: Secondary | ICD-10-CM | POA: Diagnosis not present

## 2021-05-20 DIAGNOSIS — Y92410 Unspecified street and highway as the place of occurrence of the external cause: Secondary | ICD-10-CM | POA: Insufficient documentation

## 2021-05-20 DIAGNOSIS — S46002A Unspecified injury of muscle(s) and tendon(s) of the rotator cuff of left shoulder, initial encounter: Secondary | ICD-10-CM

## 2021-05-20 DIAGNOSIS — S6992XA Unspecified injury of left wrist, hand and finger(s), initial encounter: Secondary | ICD-10-CM | POA: Diagnosis present

## 2021-05-20 DIAGNOSIS — Z7982 Long term (current) use of aspirin: Secondary | ICD-10-CM | POA: Diagnosis not present

## 2021-05-20 DIAGNOSIS — S46912A Strain of unspecified muscle, fascia and tendon at shoulder and upper arm level, left arm, initial encounter: Secondary | ICD-10-CM | POA: Diagnosis not present

## 2021-05-20 DIAGNOSIS — Z8616 Personal history of COVID-19: Secondary | ICD-10-CM | POA: Diagnosis not present

## 2021-05-20 DIAGNOSIS — M25512 Pain in left shoulder: Secondary | ICD-10-CM | POA: Diagnosis not present

## 2021-05-20 MED ORDER — BACLOFEN 10 MG PO TABS: 10.0000 mg | ORAL_TABLET | Freq: Three times a day (TID) | ORAL | 0 refills | Status: AC

## 2021-05-20 MED ORDER — MELOXICAM 15 MG PO TABS: 15.0000 mg | ORAL_TABLET | Freq: Every day | ORAL | 2 refills | Status: DC

## 2021-05-20 NOTE — Discharge Instructions (Addendum)
Follow-up with orthopedics if not improving in 2 to 3 days.  Return emergency department for worsening Take the meloxicam and baclofen as prescribed Apply ice to the left shoulder Wear the sling for 2 to 3 days.

## 2021-05-20 NOTE — ED Triage Notes (Signed)
Pt called for triage, no response. 
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
## 2021-05-21 ENCOUNTER — Telehealth: Payer: Self-pay

## 2021-05-21 NOTE — Telephone Encounter (Signed)
Transition Care Management Follow-up Telephone Call Date of discharge and from where: 05/20/2021-[facility] How have you been since you were released from the hospital? Patient stated she is doing ok.  Any questions or concerns? No  Items Reviewed: Did the pt receive and understand the discharge instructions provided? Yes  Medications obtained and verified? Yes  Other? No  Any new allergies since your discharge? No  Dietary orders reviewed? N/A Do you have support at home? Yes   Home Care and Equipment/Supplies: Were home health services ordered? not applicable If so, what is the name of the agency? N/A  Has the agency set up a time to come to the patient's home? not applicable Were any new equipment or medical supplies ordered?  No What is the name of the medical supply agency? N/A Were you able to get the supplies/equipment? not applicable Do you have any questions related to the use of the equipment or supplies? No  Functional Questionnaire: (I = Independent and D = Dependent) ADLs: I  Bathing/Dressing- I  Meal Prep- I  Eating- I  Maintaining continence- I  Transferring/Ambulation- I  Managing Meds- I  Follow up appointments reviewed:  PCP Hospital f/u appt confirmed? No   Specialist Hospital f/u appt confirmed? No   Are transportation arrangements needed? No  If their condition worsens, is the pt aware to call PCP or go to the Emergency Dept.? Yes Was the patient provided with contact information for the PCP's office or ED? Yes Was to pt encouraged to call back with questions or concerns? Yes

## 2021-05-25 DIAGNOSIS — M7542 Impingement syndrome of left shoulder: Secondary | ICD-10-CM | POA: Diagnosis not present

## 2021-06-04 DIAGNOSIS — G4733 Obstructive sleep apnea (adult) (pediatric): Secondary | ICD-10-CM | POA: Diagnosis not present

## 2021-06-06 DIAGNOSIS — Z6841 Body Mass Index (BMI) 40.0 and over, adult: Secondary | ICD-10-CM | POA: Diagnosis not present

## 2021-07-10 DIAGNOSIS — B070 Plantar wart: Secondary | ICD-10-CM

## 2021-07-13 ENCOUNTER — Other Ambulatory Visit: Payer: Self-pay | Admitting: Nurse Practitioner

## 2021-07-14 NOTE — Telephone Encounter (Signed)
Last filled 05/04/21-too soon Requested Prescriptions  Pending Prescriptions Disp Refills  . omeprazole (PRILOSEC) 40 MG capsule [Pharmacy Med Name: OMEPRAZOLE DR 40 MG CAPSULE] 90 capsule 0    Sig: TAKE 1 CAPSULE (40 MG TOTAL) BY MOUTH DAILY.     Gastroenterology: Proton Pump Inhibitors Passed - 07/13/2021 10:45 PM      Passed - Valid encounter within last 12 months    Recent Outpatient Visits          4 months ago Mild episode of recurrent major depressive disorder (HCC)   [facility] Cannady, Jolene T, NP   5 months ago Mild episode of recurrent major depressive disorder (HCC)   [facility] Cannady, Jolene T, NP   8 months ago Lab test positive for detection of COVID-19 virus   [facility], Dorie Rank, NP   12 months ago Pruritus   [facility] [place], [place] T, NP   1 year ago Mild episode of recurrent major depressive disorder (HCC)   [facility] [place], Dorie Rank, NP

## 2021-07-15 DIAGNOSIS — B078 Other viral warts: Secondary | ICD-10-CM | POA: Diagnosis not present

## 2021-07-29 ENCOUNTER — Other Ambulatory Visit: Payer: Self-pay

## 2021-07-30 ENCOUNTER — Other Ambulatory Visit: Payer: Self-pay | Admitting: Nurse Practitioner

## 2021-07-30 MED ORDER — OMEPRAZOLE 40 MG PO CPDR: 40.0000 mg | DELAYED_RELEASE_CAPSULE | Freq: Every day | ORAL | 4 refills | Status: DC

## 2021-07-31 NOTE — Telephone Encounter (Signed)
Verified with pharmacist the order from 10/26 omeprazole 40 mg was received and ready for customer pick up. This is a duplicate-refused.

## 2021-08-15 DIAGNOSIS — E8881 Metabolic syndrome: Secondary | ICD-10-CM | POA: Diagnosis not present

## 2021-08-15 DIAGNOSIS — F32A Depression, unspecified: Secondary | ICD-10-CM | POA: Diagnosis not present

## 2021-08-15 DIAGNOSIS — K2970 Gastritis, unspecified, without bleeding: Secondary | ICD-10-CM | POA: Diagnosis not present

## 2021-08-15 DIAGNOSIS — Z6841 Body Mass Index (BMI) 40.0 and over, adult: Secondary | ICD-10-CM | POA: Diagnosis not present

## 2021-08-15 DIAGNOSIS — F419 Anxiety disorder, unspecified: Secondary | ICD-10-CM | POA: Diagnosis not present

## 2021-08-15 DIAGNOSIS — K317 Polyp of stomach and duodenum: Secondary | ICD-10-CM | POA: Diagnosis not present

## 2021-08-15 DIAGNOSIS — G4730 Sleep apnea, unspecified: Secondary | ICD-10-CM | POA: Diagnosis not present

## 2021-08-15 DIAGNOSIS — Z01818 Encounter for other preprocedural examination: Secondary | ICD-10-CM | POA: Diagnosis not present

## 2021-08-15 DIAGNOSIS — K2950 Unspecified chronic gastritis without bleeding: Secondary | ICD-10-CM | POA: Diagnosis not present

## 2021-08-15 DIAGNOSIS — Z87891 Personal history of nicotine dependence: Secondary | ICD-10-CM | POA: Diagnosis not present

## 2021-08-15 DIAGNOSIS — Z79899 Other long term (current) drug therapy: Secondary | ICD-10-CM | POA: Diagnosis not present

## 2021-08-18 DIAGNOSIS — Z6841 Body Mass Index (BMI) 40.0 and over, adult: Secondary | ICD-10-CM | POA: Diagnosis not present

## 2021-09-24 DIAGNOSIS — G4733 Obstructive sleep apnea (adult) (pediatric): Secondary | ICD-10-CM | POA: Diagnosis not present

## 2021-10-05 DIAGNOSIS — G4733 Obstructive sleep apnea (adult) (pediatric): Secondary | ICD-10-CM | POA: Diagnosis not present

## 2021-10-25 DIAGNOSIS — G4733 Obstructive sleep apnea (adult) (pediatric): Secondary | ICD-10-CM | POA: Diagnosis not present

## 2021-10-27 DIAGNOSIS — F54 Psychological and behavioral factors associated with disorders or diseases classified elsewhere: Secondary | ICD-10-CM | POA: Diagnosis not present

## 2021-10-31 DIAGNOSIS — Z713 Dietary counseling and surveillance: Secondary | ICD-10-CM | POA: Diagnosis not present

## 2021-10-31 DIAGNOSIS — Z79899 Other long term (current) drug therapy: Secondary | ICD-10-CM | POA: Diagnosis not present

## 2021-10-31 DIAGNOSIS — Z6841 Body Mass Index (BMI) 40.0 and over, adult: Secondary | ICD-10-CM | POA: Diagnosis not present

## 2021-10-31 DIAGNOSIS — K219 Gastro-esophageal reflux disease without esophagitis: Secondary | ICD-10-CM | POA: Diagnosis not present

## 2021-10-31 DIAGNOSIS — F32A Depression, unspecified: Secondary | ICD-10-CM | POA: Diagnosis not present

## 2021-10-31 DIAGNOSIS — E785 Hyperlipidemia, unspecified: Secondary | ICD-10-CM | POA: Diagnosis not present

## 2021-11-05 ENCOUNTER — Other Ambulatory Visit: Payer: Self-pay | Admitting: Family Medicine

## 2021-11-05 DIAGNOSIS — G4733 Obstructive sleep apnea (adult) (pediatric): Secondary | ICD-10-CM | POA: Diagnosis not present

## 2021-11-06 NOTE — Telephone Encounter (Signed)
Patient is overdue for appointment. Please call to schedule.  

## 2021-11-06 NOTE — Telephone Encounter (Signed)
Requested medication (s) are due for refill today: yes  Requested medication (s) are on the active medication list: yes  Last refill:  08/06/2020 #180 with 4 RF, has expired  Future visit scheduled: no, was seen in May and asked to return in Sept, no visit scheduled.  Notes to clinic:  Failed protocol of labs within 360 days, (lipids 05/2020) no upcoming appt, please assess.  Requested Prescriptions  Pending Prescriptions Disp Refills   venlafaxine XR (EFFEXOR-XR) 75 MG 24 hr capsule [Pharmacy Med Name: VENLAFAXINE HCL ER 75 MG CAP] 180 capsule 4    Sig: TAKE 1 CAPSULE BY MOUTH TWICE A DAY     Psychiatry: Antidepressants - SNRI - desvenlafaxine & venlafaxine Failed - 11/05/2021  9:41 PM      Failed - Last BP in normal range    BP Readings from Last 1 Encounters:  05/20/21 (!) 152/91          Failed - Valid encounter within last 6 months    Recent Outpatient Visits           8 months ago Mild episode of recurrent major depressive disorder (HCC)   [facility] Cannady, Jolene T, NP   9 months ago Mild episode of recurrent major depressive disorder (HCC)   [facility] Cannady, [place] T, NP   1 year ago Lab test positive for detection of COVID-19 virus   [facility] [place], [place] T, NP   1 year ago Pruritus   [facility] [place], [place] T, NP   1 year ago Mild episode of recurrent major depressive disorder (HCC)   [facility] [place], Jolene T, NP              Failed - Lipid Panel in normal range within the last 12 months    Cholesterol, Total  Date Value Ref Range Status  05/09/2020 257 (H) 100 - 199 mg/dL Final   LDL Chol Calc (NIH)  Date Value Ref Range Status  05/09/2020 183 (H) 0 - 99 mg/dL Final   HDL  Date Value Ref Range Status  05/09/2020 41 >39 mg/dL Final   Triglycerides  Date Value Ref Range Status  05/09/2020 177 (H) 0 - 149 mg/dL Final         Passed - Cr in normal range and within  360 days    Creatinine, Ser  Date Value Ref Range Status  01/31/2021 0.62 0.57 - 1.00 mg/dL Final          Passed - Completed PHQ-2 or PHQ-9 in the last 360 days

## 2021-11-07 ENCOUNTER — Other Ambulatory Visit: Payer: Self-pay

## 2021-11-07 ENCOUNTER — Ambulatory Visit: Payer: Medicaid Other | Admitting: Nurse Practitioner

## 2021-11-07 ENCOUNTER — Encounter: Payer: Self-pay | Admitting: Nurse Practitioner

## 2021-11-07 VITALS — BP 134/87 | HR 96 | Temp 98.1°F | Ht 69.0 in | Wt 366.0 lb

## 2021-11-07 DIAGNOSIS — Z6841 Body Mass Index (BMI) 40.0 and over, adult: Secondary | ICD-10-CM

## 2021-11-07 DIAGNOSIS — G4733 Obstructive sleep apnea (adult) (pediatric): Secondary | ICD-10-CM

## 2021-11-07 DIAGNOSIS — F330 Major depressive disorder, recurrent, mild: Secondary | ICD-10-CM

## 2021-11-07 DIAGNOSIS — F411 Generalized anxiety disorder: Secondary | ICD-10-CM | POA: Diagnosis not present

## 2021-11-07 MED ORDER — BUPROPION HCL ER (XL) 300 MG PO TB24: 300.0000 mg | ORAL_TABLET | Freq: Every day | ORAL | 4 refills | Status: DC

## 2021-11-07 MED ORDER — BUSPIRONE HCL 5 MG PO TABS: 5.0000 mg | ORAL_TABLET | Freq: Two times a day (BID) | ORAL | 4 refills | Status: DC | PRN

## 2021-11-07 MED ORDER — VENLAFAXINE HCL ER 75 MG PO CP24: ORAL_CAPSULE | ORAL | 4 refills | Status: DC
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
## 2021-11-07 NOTE — Assessment & Plan Note (Signed)
New diagnosis with [facility], continue CPAP daily as ordered.

## 2021-11-07 NOTE — Assessment & Plan Note (Signed)
Chronic, stable with improvement.  Denies SI/HI.  Continue Wellbutrin 300 MG XL daily, Effexor XR 75 MG BID and Buspar 5 MG BID.  Plan on return in September for physical.

## 2021-11-07 NOTE — Assessment & Plan Note (Addendum)
Chronic, stable with improvement.  Denies SI/HI.  Continue Wellbutrin 300 MG XL daily, Effexor XR 75 MG BID and Buspar 5 MG BID.  Overall improved mood at this time.  Return in 7 months for physical.  Refills sent.

## 2021-11-07 NOTE — Assessment & Plan Note (Signed)
Is working with [employer], continue this collaboration.  Recent notes and labs reviewed.

## 2021-11-07 NOTE — Patient Instructions (Signed)

## 2021-11-14 DIAGNOSIS — Z6841 Body Mass Index (BMI) 40.0 and over, adult: Secondary | ICD-10-CM | POA: Diagnosis not present

## 2021-11-14 DIAGNOSIS — E8881 Metabolic syndrome: Secondary | ICD-10-CM | POA: Diagnosis not present

## 2021-11-14 DIAGNOSIS — E785 Hyperlipidemia, unspecified: Secondary | ICD-10-CM | POA: Diagnosis not present

## 2021-11-14 DIAGNOSIS — Z7189 Other specified counseling: Secondary | ICD-10-CM | POA: Diagnosis not present

## 2021-11-14 DIAGNOSIS — G4730 Sleep apnea, unspecified: Secondary | ICD-10-CM | POA: Diagnosis not present

## 2021-11-25 DIAGNOSIS — G4733 Obstructive sleep apnea (adult) (pediatric): Secondary | ICD-10-CM | POA: Diagnosis not present

## 2021-12-01 ENCOUNTER — Ambulatory Visit (INDEPENDENT_AMBULATORY_CARE_PROVIDER_SITE_OTHER): Payer: Medicaid Other | Admitting: Internal Medicine

## 2021-12-01 VITALS — Ht 69.0 in | Wt 366.0 lb

## 2021-12-01 DIAGNOSIS — G4733 Obstructive sleep apnea (adult) (pediatric): Secondary | ICD-10-CM

## 2021-12-01 DIAGNOSIS — Z9989 Dependence on other enabling machines and devices: Secondary | ICD-10-CM | POA: Diagnosis not present

## 2021-12-01 DIAGNOSIS — Z7189 Other specified counseling: Secondary | ICD-10-CM | POA: Insufficient documentation

## 2021-12-01 NOTE — Patient Instructions (Signed)
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
## 2021-12-03 DIAGNOSIS — G4733 Obstructive sleep apnea (adult) (pediatric): Secondary | ICD-10-CM | POA: Diagnosis not present

## 2022-01-03 DIAGNOSIS — G4733 Obstructive sleep apnea (adult) (pediatric): Secondary | ICD-10-CM | POA: Diagnosis not present

## 2022-01-23 DIAGNOSIS — G4733 Obstructive sleep apnea (adult) (pediatric): Secondary | ICD-10-CM | POA: Diagnosis not present

## 2022-01-24 DIAGNOSIS — M1711 Unilateral primary osteoarthritis, right knee: Secondary | ICD-10-CM | POA: Diagnosis not present

## 2022-02-02 DIAGNOSIS — G4733 Obstructive sleep apnea (adult) (pediatric): Secondary | ICD-10-CM | POA: Diagnosis not present

## 2022-02-16 DIAGNOSIS — M1711 Unilateral primary osteoarthritis, right knee: Secondary | ICD-10-CM | POA: Diagnosis not present

## 2022-02-16 DIAGNOSIS — M179 Osteoarthritis of knee, unspecified: Secondary | ICD-10-CM | POA: Insufficient documentation

## 2022-02-22 DIAGNOSIS — G4733 Obstructive sleep apnea (adult) (pediatric): Secondary | ICD-10-CM | POA: Diagnosis not present

## 2022-02-24 ENCOUNTER — Encounter: Payer: Self-pay | Admitting: Nurse Practitioner

## 2022-02-25 ENCOUNTER — Ambulatory Visit: Payer: Medicaid Other | Admitting: Nurse Practitioner

## 2022-02-25 ENCOUNTER — Encounter: Payer: Self-pay | Admitting: Nurse Practitioner

## 2022-02-25 VITALS — BP 136/80 | HR 91 | Temp 99.3°F | Ht 69.0 in | Wt 366.6 lb

## 2022-02-25 DIAGNOSIS — T8332XA Displacement of intrauterine contraceptive device, initial encounter: Secondary | ICD-10-CM | POA: Diagnosis not present

## 2022-02-25 DIAGNOSIS — B9689 Other specified bacterial agents as the cause of diseases classified elsewhere: Secondary | ICD-10-CM | POA: Diagnosis not present

## 2022-02-25 DIAGNOSIS — N939 Abnormal uterine and vaginal bleeding, unspecified: Secondary | ICD-10-CM | POA: Diagnosis not present

## 2022-02-25 DIAGNOSIS — N760 Acute vaginitis: Secondary | ICD-10-CM | POA: Diagnosis not present

## 2022-02-25 LAB — URINALYSIS, ROUTINE W REFLEX MICROSCOPIC
Bilirubin, UA: NEGATIVE
Glucose, UA: NEGATIVE
Ketones, UA: NEGATIVE
Leukocytes,UA: NEGATIVE
Nitrite, UA: NEGATIVE
Protein,UA: NEGATIVE
Specific Gravity, UA: 1.015 (ref 1.005–1.030)
Urobilinogen, Ur: 0.2 mg/dL (ref 0.2–1.0)
pH, UA: 6 (ref 5.0–7.5)

## 2022-02-25 LAB — WET PREP FOR TRICH, YEAST, CLUE
Clue Cell Exam: POSITIVE — AB
Trichomonas Exam: NEGATIVE
Yeast Exam: NEGATIVE

## 2022-02-25 LAB — MICROSCOPIC EXAMINATION: Bacteria, UA: NONE SEEN

## 2022-02-25 LAB — PREGNANCY, URINE: Preg Test, Ur: NEGATIVE

## 2022-02-25 MED ORDER — METRONIDAZOLE 500 MG PO TABS: 500.0000 mg | ORAL_TABLET | Freq: Two times a day (BID) | ORAL | 0 refills | Status: AC
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
## 2022-02-25 NOTE — Assessment & Plan Note (Signed)
Not noted on exam, discussed with patient.  Will obtain imaging to further assess and place referral to GYN.

## 2022-02-25 NOTE — Patient Instructions (Signed)
Abnormal Uterine Bleeding ? ?Abnormal uterine bleeding is unusual bleeding from the uterus. It includes bleeding after sex, or bleeding or spotting between menstrual periods. It may also include bleeding that is heavier than normal, menstrual periods that last longer than usual, or bleeding that occurs after menopause. ?Abnormal uterine bleeding can affect teenagers, women in their reproductive years, pregnant women, and women who have reached menopause. Common causes of abnormal uterine bleeding include: ?Pregnancy. ?Abnormal growths within the lining of the uterus (polyps). ?Benign tumors or growths in the uterus (fibroids). These are not cancer. ?Infection. ?Cancer. ?Too much or too little of some hormones in the body (hormonal imbalances). ?Any type of abnormal bleeding should be checked by a health care provider. Many cases are minor and simple to treat, but others may be more serious. Treatment will depend on the cause of the bleeding and how severe it is. ?Follow these instructions at home: ?Medicines ?Take over-the-counter and prescription medicines only as told by your health care provider. ?Ask your health care provider about: ?Taking medicines such as aspirin and ibuprofen. These medicines can thin your blood. Do not take these medicines unless your health care provider tells you to take them. ?Taking over-the-counter medicines, vitamins, herbs, and supplements. ?If you were prescribed iron pills, take them as told by your health care provider. Iron pills help to replace iron that your body loses because of this condition. ?Managing constipation ?In cases of severe bleeding, you may be asked to increase your iron intake to treat anemia. Doing this may cause constipation. To prevent or treat constipation, you may need to: ?Drink enough fluid to keep your urine pale yellow. ?Take over-the-counter or prescription medicines. ?Eat foods that are high in fiber, such as beans, whole grains, and fresh fruits and  vegetables. ?Limit foods that are high in fat and processed sugars, such as fried or sweet foods. ?Activity ?Alter your activity to decrease bleeding if you need to change your sanitary pad more than one time every 2 hours: ?Lie in bed with your feet raised (elevated). ?Place a cold pack on your lower abdomen. ?Rest as much as possible until the bleeding stops or slows down. ?General instructions ?Do not use tampons, douche, or have sex until your health care provider says these things are okay. ?Change your sanitary pads often. ?Get regular exams. These include pelvic exams and cervical cancer screenings. ?It is up to you to get the results of any tests that are done. Ask your health care provider, or the department that is doing the tests, when your results will be ready. ?Monitor your condition for any changes. For 2 months, write down: ?When your menstrual period starts. ?When your menstrual period ends. ?When any abnormal vaginal bleeding occurs. ?What problems you notice. ?Keep all follow-up visits. This is important. ?Contact a health care provider if: ?You have bleeding that lasts for more than one week. ?You feel dizzy at times. ?You feel nauseous or you vomit. ?You feel light-headed or weak. ?You notice any other changes that show that your condition is getting worse. ?Get help right away if: ?You faint. ?You have bleeding that soaks through a sanitary pad every hour. ?You have pain in the abdomen. ?You have a fever or chills. ?You become sweaty or weak. ?You pass large blood clots from your vagina. ?These symptoms may represent a serious problem that is an emergency. Do not wait to see if the symptoms will go away. Get medical help right away. Call your local emergency services (  911 in the [place] Do not drive yourself to the hospital. ?Summary ?Abnormal uterine bleeding is unusual bleeding from the uterus. ?Any type of abnormal bleeding should be checked by a health care provider. Many cases are minor and  simple to treat, but others may be more serious. ?Treatment will depend on the cause of the bleeding and how severe it is. ?Get help right away if you faint, you have bleeding that soaks through a sanitary pad every hour, or you pass large blood clots from your vagina. ?This information is not intended to replace advice given to you by your health care provider. Make sure you discuss any questions you have with your health care provider. ?Document Revised: 01/21/2021 Document Reviewed: 01/21/2021 ?Elsevier Patient Education ? 2023 [employer] ? ?

## 2022-02-25 NOTE — Assessment & Plan Note (Signed)
Recent change in cycle, IUD in place.  Urine pregnancy testing negative.  Wet prep + for BV, treatment sent.  Urinalysis negative.  On internal exam IUD strings missing, will place order for imaging to further assess cervix and uterus.  Referral to GYN placed.

## 2022-02-27 ENCOUNTER — Ambulatory Visit: Payer: Medicaid Other | Admitting: Nurse Practitioner

## 2022-02-27 ENCOUNTER — Ambulatory Visit
Admission: RE | Admit: 2022-02-27 | Discharge: 2022-02-27 | Disposition: A | Discharge status: Home / Self Care | Payer: Medicaid Other | Source: Ambulatory Visit | Attending: Nurse Practitioner | Admitting: Nurse Practitioner

## 2022-02-27 DIAGNOSIS — T8332XA Displacement of intrauterine contraceptive device, initial encounter: Secondary | ICD-10-CM | POA: Diagnosis not present

## 2022-02-27 DIAGNOSIS — N939 Abnormal uterine and vaginal bleeding, unspecified: Secondary | ICD-10-CM | POA: Diagnosis not present

## 2022-02-27 DIAGNOSIS — Z975 Presence of (intrauterine) contraceptive device: Secondary | ICD-10-CM | POA: Diagnosis not present

## 2022-02-27 MED ORDER — NYSTATIN 100000 UNIT/GM EX POWD: 1.0000 "application " | Freq: Three times a day (TID) | CUTANEOUS | 0 refills | Status: DC

## 2022-02-27 NOTE — Progress Notes (Signed)
Contacted via MyChart   Good news, via ultrasound the IUD appears to be in place.  The strings just are not protruding through cervical os on exam, they were cut very well.  We will see what GYN says about the bleeding with your changes.  Any questions for me:) Keep being amazing!!  Thank you for allowing me to participate in your care.  I appreciate you. Kindest regards, Rachel Cooley

## 2022-03-03 ENCOUNTER — Encounter: Payer: Self-pay | Admitting: Nurse Practitioner

## 2022-03-03 ENCOUNTER — Ambulatory Visit: Payer: Self-pay | Admitting: *Deleted

## 2022-03-03 ENCOUNTER — Ambulatory Visit: Payer: Medicaid Other | Admitting: Nurse Practitioner

## 2022-03-03 VITALS — BP 149/91 | HR 105 | Wt 366.0 lb

## 2022-03-03 DIAGNOSIS — T8332XD Displacement of intrauterine contraceptive device, subsequent encounter: Secondary | ICD-10-CM | POA: Diagnosis not present

## 2022-03-03 DIAGNOSIS — N898 Other specified noninflammatory disorders of vagina: Secondary | ICD-10-CM | POA: Insufficient documentation

## 2022-03-03 MED ORDER — VALACYCLOVIR HCL 1 G PO TABS: 1000.0000 mg | ORAL_TABLET | Freq: Two times a day (BID) | ORAL | 0 refills | Status: AC

## 2022-03-03 MED ORDER — CLINDESSE 2 % VA CREA: 1.0000 | TOPICAL_CREAM | Freq: Once | VAGINAL | 0 refills | Status: AC

## 2022-03-03 MED ORDER — FLUCONAZOLE 150 MG PO TABS: 150.0000 mg | ORAL_TABLET | Freq: Once | ORAL | 0 refills | Status: AC

## 2022-03-03 NOTE — Patient Instructions (Signed)

## 2022-03-03 NOTE — Addendum Note (Signed)
Addended by: Aura Dials T on: 03/03/2022 10:52 AM   Modules accepted: Orders

## 2022-03-03 NOTE — Progress Notes (Signed)
BP (!) 149/91   Pulse (!) 105   Wt (!) 366 lb (166 kg)   SpO2 99%   BMI 54.05 kg/m    Subjective:    Patient ID: Rachel Cooley, female    DOB: Feb 13, 1981, 41 y.o.   MRN: [id number]  HPI: Rachel Cooley is a 41 y.o. female  Chief Complaint  Patient presents with   Vaginal Pain    Patient is here today for vaginal burning and possible lesions for the past week. Patient states she think she may have bacterial vaginosis. Patient states the medication she was given at last appointment isn't helping with her current issue. Patient states she has been using cold compress to the area and states her underwear were stuck to her lesions from the drainage and says she is very uncomfortable. Patient states her vaginal is very painful.    Vaginal Bleeding    Patient states the vaginal bleeding has since stopped since her last in office visit.    VAGINAL DISCHARGE Wet prep on 02/25/22 noted BV and she was treated with Flagyl which she reports feeling has not offered benefit.  Lesions started presenting Saturday to labia, no history of herpes.  She showed pictures to provider on phone of before and after. Initially lesions had blisters and these drained.  She is concerned it is blisters from her recent BV.  Having pain -- is a felt like clitoris was being held in a vice type of pain -- squeezing pressure and pain + burning. Duration: days Discharge description: white  Pruritus: yes Dysuria: no Malodorous: no Urinary frequency: no Fevers: no Abdominal pain: no  Sexual activity: monogamous History of sexually transmitted diseases: no Recent antibiotic use: no Context: ongoing  Treatments attempted: none   Relevant past medical, surgical, family and social history reviewed and updated as indicated. Interim medical history since our last visit reviewed. Allergies and medications reviewed and updated.  Review of Systems  Constitutional:  Negative for activity change, appetite change,  diaphoresis, fatigue and fever.  Respiratory:  Negative for cough, chest tightness and shortness of breath.   Cardiovascular:  Negative for chest pain, palpitations and leg swelling.  Gastrointestinal: Negative.   Genitourinary:  Positive for genital sores, vaginal discharge and vaginal pain. Negative for dysuria, frequency, hematuria, menstrual problem, pelvic pain, urgency and vaginal bleeding.  Neurological: Negative.   Psychiatric/Behavioral: Negative.     Per HPI unless specifically indicated above     Objective:    BP (!) 149/91   Pulse (!) 105   Wt (!) 366 lb (166 kg)   SpO2 99%   BMI 54.05 kg/m   Wt Readings from Last 3 Encounters:  03/03/22 (!) 366 lb (166 kg)  02/25/22 (!) 366 lb 9.6 oz (166.3 kg)  12/01/21 (!) 366 lb (166 kg)    Physical Exam Vitals and nursing note reviewed. Exam conducted with a chaperone present.  Constitutional:      General: She is awake. She is not in acute distress.    Appearance: She is well-developed and well-groomed. She is morbidly obese. She is not ill-appearing or toxic-appearing.  HENT:     Head: Normocephalic.     Right Ear: Hearing normal.     Left Ear: Hearing normal.  Eyes:     General: Lids are normal.        Right eye: No discharge.        Left eye: No discharge.     Conjunctiva/sclera: Conjunctivae normal.  Pupils: Pupils are equal, round, and reactive to light.  Neck:     Thyroid: No thyromegaly.     Vascular: No carotid bruit.  Cardiovascular:     Rate and Rhythm: Normal rate and regular rhythm.     Heart sounds: Normal heart sounds. No murmur heard.   No gallop.  Pulmonary:     Effort: Pulmonary effort is normal. No accessory muscle usage or respiratory distress.     Breath sounds: Normal breath sounds.  Abdominal:     General: Bowel sounds are normal. There is no distension.     Palpations: Abdomen is soft.     Tenderness: There is no abdominal tenderness.  Genitourinary:    Exam position: Lithotomy  position.     Pubic Area: No rash.      Labia:        Right: Rash present.        Left: Rash present.      Comments: Pelvic exam deferred -- patient showed pictures to patient on phone from prior to outbreak and during.  Recent photo with multiple circular lesions noted around labia exterior and upward -- lesions with erythema at base and whitish appearance exterior.  All not currently intact, no blisters present.   Musculoskeletal:     Cervical back: Normal range of motion and neck supple.     Right lower leg: No edema.     Left lower leg: No edema.  Skin:    General: Skin is warm and dry.  Neurological:     Mental Status: She is alert and oriented to person, place, and time.  Psychiatric:        Attention and Perception: Attention normal.        Mood and Affect: Mood normal.        Speech: Speech normal.        Behavior: Behavior normal. Behavior is cooperative.        Thought Content: Thought content normal.   Results for orders placed or performed in visit on 02/25/22  WET PREP FOR TRICH, YEAST, CLUE   Specimen: Sterile Swab   Sterile Swab  Result Value Ref Range   Trichomonas Exam Negative Negative   Yeast Exam Negative Negative   Clue Cell Exam Positive (A) Negative  Microscopic Examination   Urine  Result Value Ref Range   WBC, UA 0-5 0 - 5 /hpf   RBC 0-2 0 - 2 /hpf   Epithelial Cells (non renal) 0-10 0 - 10 /hpf   Bacteria, UA None seen None seen/Few  Urinalysis, Routine w reflex microscopic  Result Value Ref Range   Specific Gravity, UA 1.015 1.005 - 1.030   pH, UA 6.0 5.0 - 7.5   Color, UA Yellow Yellow   Appearance Ur Clear Clear   Leukocytes,UA Negative Negative   Protein,UA Negative Negative/Trace   Glucose, UA Negative Negative   Ketones, UA Negative Negative   RBC, UA 2+ (A) Negative   Bilirubin, UA Negative Negative   Urobilinogen, Ur 0.2 0.2 - 1.0 mg/dL   Nitrite, UA Negative Negative   Microscopic Examination See below:   Pregnancy, urine  Result  Value Ref Range   Preg Test, Ur Negative Negative      Assessment & Plan:   Problem List Items Addressed This Visit       Genitourinary   Vaginal lesion - Primary    Recent BV == ?related to infection vs herpes outbreak.  Will change to Clindamycin gel for BV  treatment + have sent in Diflucan -- unable to repeat wet prep today as in house lab member out sick.  Check labs today: HIV, Gon//Chlam, RPR, HSV.  Will start Valtrex at this time and educated patient on this, concern for herpes based on her symptoms and appearance of lesions.  Return in one week for recheck, sooner if worsening.       Relevant Orders   GC/Chlamydia Probe Amp   HIV Antibody (routine testing w rflx)   HSV(herpes simplex vrs) 1+2 ab-IgG   RPR     Other   IUD threads lost    Recent vaginal ultrasound showed IUD is in place, reassured patient.         Follow up plan: Return in about 1 week (around 03/10/2022) for Vaginal lesions.

## 2022-03-03 NOTE — Assessment & Plan Note (Signed)
Recent vaginal ultrasound showed IUD is in place, reassured patient.

## 2022-03-03 NOTE — Assessment & Plan Note (Signed)
Recent BV == ?related to infection vs herpes outbreak.  Will change to Clindamycin gel for BV treatment + have sent in Diflucan -- unable to repeat wet prep today as in house lab member out sick.  Check labs today: HIV, Gon//Chlam, RPR, HSV.  Will start Valtrex at this time and educated patient on this, concern for herpes based on her symptoms and appearance of lesions.  Return in one week for recheck, sooner if worsening.

## 2022-03-03 NOTE — Telephone Encounter (Signed)
  Chief Complaint: Severe external burning with lesions around vaginal area.  Denies urinary symptoms.   (Seen last wk being treated for BV) Symptoms: above    Sitting on an ice pack the burning is so bad. Frequency: Since she was seen last Wed.    Not any better and the burning is worse. Pertinent Negatives: Patient denies vaginal bleeding now.   No abd pain. Disposition: [] ED /[] Urgent Care (no appt availability in office) / [x] Appointment(In office/virtual)/ []  [facility]/ [] Home Care/ [] Refused Recommended Disposition /[] [facility]/ []  Follow-up with PCP Additional Notes: Appt made for today at 4:20 with , NP.

## 2022-03-03 NOTE — Telephone Encounter (Signed)
Message from Crist Infante sent at 03/03/2022  9:34 AM EDT  Summary: pt not better   Pt states she has sent several messages to the dr w/ no response.  Pt states the abx she was taking did not work and she is no better.  Asked to speak w/the nurse line.  I do not see any mychart messages.          Reason for Disposition  [1] Genital area looks infected (e.g., draining sore, spreading redness) AND [2] fever  Answer Assessment - Initial Assessment Questions 1. SYMPTOM: "What's the main symptom you're concerned about?" (e.g., pain, itching, dryness)     Wed. I saw provider due to vaginal bleeding.   Put on Flagyl now for a week.  Had a U/S Friday.   I'm having so much vaginal pain.   I'm taking 800 mg ibuprofen every 8 hrs.   I'm taking Azo too.    No urinary burning.    2. LOCATION: "Where is the  Labia located?" (e.g., inside/outside, left/right)     I'm burning so bad on the external area.    There are lesions and I have bacterial vaginosis.   drainage.    I'm wearing a pad due to drainage.   I'm sitting on ice packs. 3. ONSET: "When did the  Vaginal burning  start?"     A week ago it started.    I saw her a week ago tomorrow. 4. PAIN: "Is there any pain?" If Yes, ask: "How bad is it?" (Scale: 1-10; mild, moderate, severe)     Yes bad 5. ITCHING: "Is there any itching?" If Yes, ask: "How bad is it?" (Scale: 1-10; mild, moderate, severe)     No 6. CAUSE: "What do you think is causing the discharge?" "Have you had the same problem before? What happened then?"     I don't know 7. OTHER SYMPTOMS: "Do you have any other symptoms?" (e.g., fever, itching, vaginal bleeding, pain with urination, injury to genital area, vaginal foreign body)     Bleeding stopped.   8. PREGNANCY: "Is there any chance you are pregnant?" "When was your last menstrual period?"     No  Protocols used: Vaginal Symptoms-A-AH

## 2022-03-04 ENCOUNTER — Ambulatory Visit: Payer: Medicaid Other

## 2022-03-04 ENCOUNTER — Encounter: Payer: Self-pay | Admitting: Nurse Practitioner

## 2022-03-04 LAB — HIV ANTIBODY (ROUTINE TESTING W REFLEX): HIV Screen 4th Generation wRfx: NONREACTIVE

## 2022-03-04 LAB — HSV(HERPES SIMPLEX VRS) I + II AB-IGG
HSV 1 Glycoprotein G Ab, IgG: <0.91 index (ref 0.00–0.90)
HSV 2 IgG, Type Spec: <0.91 index (ref 0.00–0.90)

## 2022-03-04 LAB — RPR: RPR Ser Ql: NONREACTIVE

## 2022-03-04 NOTE — Progress Notes (Signed)
Contacted via [facility] morning [facility], your labs have returned with exception of urine.  It may be more related to the BV, so highly recommend getting the Clindamycin and see if this clears up the BV.  Herpes testing is negative, to be on safe side though I would complete the Valtrex I ordered -- although this does not appear to be herpes based on labs I prefer to be cautious.  HIV and syphilis are negative:):) Keep being amazing!!  Thank you for allowing me to participate in your care.  I appreciate you. Kindest regards, Derric Dealmeida

## 2022-03-05 DIAGNOSIS — G4733 Obstructive sleep apnea (adult) (pediatric): Secondary | ICD-10-CM | POA: Diagnosis not present

## 2022-03-06 ENCOUNTER — Encounter: Payer: Self-pay | Admitting: Nurse Practitioner

## 2022-03-06 LAB — GC/CHLAMYDIA PROBE AMP
Chlamydia trachomatis, NAA: NEGATIVE
Neisseria Gonorrhoeae by PCR: NEGATIVE

## 2022-03-06 NOTE — Progress Notes (Signed)
Contacted via [facility] and chlamydia testing is negative:)

## 2022-03-10 ENCOUNTER — Encounter: Payer: Self-pay | Admitting: Nurse Practitioner

## 2022-03-11 DIAGNOSIS — F411 Generalized anxiety disorder: Secondary | ICD-10-CM | POA: Diagnosis not present

## 2022-03-11 DIAGNOSIS — K219 Gastro-esophageal reflux disease without esophagitis: Secondary | ICD-10-CM | POA: Diagnosis not present

## 2022-03-11 DIAGNOSIS — E785 Hyperlipidemia, unspecified: Secondary | ICD-10-CM | POA: Diagnosis not present

## 2022-03-11 DIAGNOSIS — F32A Depression, unspecified: Secondary | ICD-10-CM | POA: Diagnosis not present

## 2022-03-23 ENCOUNTER — Ambulatory Visit (INDEPENDENT_AMBULATORY_CARE_PROVIDER_SITE_OTHER): Payer: Medicaid Other | Admitting: Internal Medicine

## 2022-03-23 DIAGNOSIS — Z9989 Dependence on other enabling machines and devices: Secondary | ICD-10-CM | POA: Diagnosis not present

## 2022-03-23 DIAGNOSIS — Z7189 Other specified counseling: Secondary | ICD-10-CM

## 2022-03-23 DIAGNOSIS — G4733 Obstructive sleep apnea (adult) (pediatric): Secondary | ICD-10-CM

## 2022-03-23 NOTE — Patient Instructions (Signed)

## 2022-03-23 NOTE — Progress Notes (Signed)
[facility] [place], [place] [address]  Pulmonary Sleep Medicine   Office Visit Note  Patient Name: Rachel Cooley DOB: 02/10/1981 MRN [id number]    Chief Complaint: Obstructive Sleep Apnea visit  Brief History:  Lametria is seen today for follow up . The patient has a 10 month history of sleep apnea. Patient is using PAP nightly with medium F30 full face mask.  The patient feels the same after sleeping with PAP.  Epworth Sleepiness Score is 5 out of 24. The patient rarely  take naps. The patient complains of the following: no complaints  The compliance download shows  compliance with an average use time of 7:40  hours @ 99%. The AHI is 1.7. Patient continues to need PAP to treat OSA and therapy continues to be medically necessary.  The patient does not complain of limb movements disrupting sleep.  ROS  General: (-) fever, (-) chills, (-) night sweat Nose and Sinuses: (-) nasal stuffiness or itchiness, (-) postnasal drip, (-) nosebleeds, (-) sinus trouble. Mouth and Throat: (-) sore throat, (-) hoarseness. Neck: (-) swollen glands, (-) enlarged thyroid, (-) neck pain. Respiratory: - cough, - shortness of breath, - wheezing. Neurologic: - numbness, - tingling. Psychiatric: - anxiety, - depression   Current Medication: Outpatient Encounter Medications as of 03/23/2022  Medication Sig   aspirin 81 MG chewable tablet Chew by mouth.   buPROPion (WELLBUTRIN XL) 300 MG 24 hr tablet Take 1 tablet (300 mg total) by mouth daily.   busPIRone (BUSPAR) 5 MG tablet Take 1 tablet (5 mg total) by mouth 2 (two) times daily as needed (for anxiety).   cetirizine (ZYRTEC) 10 MG tablet Take by mouth.   Cholecalciferol 1.25 MG (50000 UT) capsule cholecalciferol (vitamin D3) 1,250 mcg (50,000 unit) capsule  TAKE 1 TABLET BY MOUTH ONE TIME PER WEEK   docusate sodium (COLACE) 100 MG capsule Take 100 mg by mouth at bedtime.   Multiple Vitamin (MULTIVITAMIN) tablet Take 1  tablet by mouth daily.   nystatin (MYCOSTATIN/NYSTOP) powder Apply 1 application. topically 3 (three) times daily.   omeprazole (PRILOSEC) 40 MG capsule Take 1 capsule (40 mg total) by mouth daily.   paragard intrauterine copper IUD IUD by Intrauterine route.   venlafaxine XR (EFFEXOR-XR) 75 MG 24 hr capsule TAKE 1 CAPSULE BY MOUTH TWICE A DAY   No facility-administered encounter medications on file as of 03/23/2022.    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    Obesity    Preeclampsia, severe, third trimester    Shortness of breath     Family History: Non contributory to the present illness  Social History: Social History   Socioeconomic History   Marital status: Single    Spouse name: Not on file   Number of children: 2   Years of education: Not on file   Highest education level: Not on file  Occupational History   Not on file  Tobacco Use   Smoking status: Former    Types: Cigarettes  Smokeless tobacco: Never   Tobacco comments:    stopped with +UPT  Vaping Use   Vaping Use: Never used  Substance and Sexual Activity   Alcohol use: No   Drug use: No   Sexual activity: Yes    Partners: Male    Birth control/protection: Pill  Other Topics Concern   Not on file  Social History Narrative   Not on file   Social Determinants of Health   Financial Resource Strain: Low Risk  (04/28/2019)   Overall Financial Resource Strain (CARDIA)    Difficulty of Paying Living Expenses: Not very hard  Food Insecurity: No Food Insecurity (04/28/2019)   Hunger Vital Sign     Worried About Running Out of Food in the Last Year: Never true    Ran Out of Food in the Last Year: Never true  Transportation Needs: No Transportation Needs (04/28/2019)   PRAPARE - Hydrologist (Medical): No    Lack of Transportation (Non-Medical): No  Physical Activity: Sufficiently Active (04/28/2019)   Exercise Vital Sign    Days of Exercise per Week: 5 days    Minutes of Exercise per Session: 30 min  Stress: Stress Concern Present (04/28/2019)   [place]    Feeling of Stress : To some extent  Social Connections: Unknown (04/28/2019)   Social Connection and Isolation Panel [NHANES]    Frequency of Communication with Friends and Family: Three times a week    Frequency of Social Gatherings with Friends and Family: Three times a week    Attends Religious Services: Not asked    Active Member of Clubs or Organizations: Not on file    Attends Club or Organization Meetings: Not on file    Marital Status: Married  Intimate Partner Violence: Not At Risk (04/28/2019)   Humiliation, Afraid, Rape, and Kick questionnaire    Fear of Current or Ex-Partner: No    Emotionally Abused: No    Physically Abused: No    Sexually Abused: No    Vital Signs: unknown if currently breastfeeding. There is no height or weight on file to calculate BMI.    Examination: General Appearance: The patient is well-developed, well-nourished, and in no distress. Neck Circumference:  Skin: Gross inspection of skin unremarkable. Head: normocephalic, no gross deformities. Eyes: no gross deformities noted. ENT: ears appear grossly normal Neurologic: Alert and oriented. No involuntary movements.    EPWORTH SLEEPINESS SCALE:  Scale:  (0)= no chance of dozing; (1)= slight chance of dozing; (2)= moderate chance of dozing; (3)= high chance of dozing  Chance  Situtation    Sitting and reading: 1    Watching TV: 0     Sitting Inactive in public: 0    As a passenger in car: 1      Lying down to rest: 3    Sitting and talking: 0    Sitting quielty after lunch: 0    In a car, stopped in traffic: 0   TOTAL SCORE:   5 out of 24    SLEEP STUDIES:  HST 06/04/21  -  RDI  8.1/Hr,  min SpO2 @ 73%   CPAP COMPLIANCE DATA:  Date Range: 12/17/21 - 03/16/22  Average Daily Use: 7:40 hours  Median Use: 7:50 hours  Compliance for > 4 Hours: 99% days  AHI: 1.7 respiratory events per hour  Days Used: 90/90  Mask Leak: 7.9 lpm  95th Percentile Pressure: 11.9 cmH2O  LABS: Recent Results (from the past 2160 hour(s))  Urinalysis, Routine w reflex microscopic     Status: Abnormal   Collection Time: 02/25/22  3:13 PM  Result Value Ref Range   Specific Gravity, UA 1.015 1.005 - 1.030   pH, UA 6.0 5.0 - 7.5   Color, UA Yellow Yellow   Appearance Ur Clear Clear   Leukocytes,UA Negative Negative   Protein,UA Negative Negative/Trace   Glucose, UA Negative Negative   Ketones, UA Negative Negative   RBC, UA 2+ (A) Negative   Bilirubin, UA Negative Negative   Urobilinogen, Ur 0.2 0.2 - 1.0 mg/dL   Nitrite, UA Negative Negative   Microscopic Examination See below:   Pregnancy, urine     Status: None   Collection Time: 02/25/22  3:13 PM  Result Value Ref Range   Preg Test, Ur Negative Negative  WET PREP FOR TRICH, YEAST, CLUE     Status: Abnormal   Collection Time: 02/25/22  3:13 PM   Specimen: Sterile Swab   Sterile Swab  Result Value Ref Range   Trichomonas Exam Negative Negative   Yeast Exam Negative Negative   Clue Cell Exam Positive (A) Negative  Microscopic Examination     Status: None   Collection Time: 02/25/22  3:13 PM   Urine  Result Value Ref Range   WBC, UA 0-5 0 - 5 /hpf   RBC 0-2 0 - 2 /hpf   Epithelial Cells (non renal) 0-10 0 - 10 /hpf   Bacteria, UA None seen None seen/Few  HIV Antibody (routine testing w rflx)     Status: None   Collection Time: 03/03/22   5:12 PM  Result Value Ref Range   HIV Screen 4th Generation wRfx Non Reactive Non Reactive    Comment: HIV Negative HIV-1/HIV-2 antibodies and HIV-1 p24 antigen were NOT detected. There is no laboratory evidence of HIV infection.   HSV(herpes simplex vrs) 1+2 ab-IgG     Status: None   Collection Time: 03/03/22  5:12 PM  Result Value Ref Range   HSV 1 Glycoprotein G Ab, IgG <0.91 0.00 - 0.90 index    Comment:                                  Negative        <0.91                                  Equivocal 0.91 - 1.09                                  Positive        >1.09  Note: Negative indicates no antibodies detected to  HSV-1. Equivocal may suggest early infection.  If  clinically appropriate, retest at later date. Positive  indicates antibodies detected to HSV-1.    HSV 2 IgG, Type Spec <0.91 0.00 - 0.90 index    Comment:                                  Negative        <0.91  Equivocal 0.91 - 1.09                                  Positive        >1.09  Note: Negative indicates no HSV-2 antibodies detected.  Positive indicates HSV-2 antibodies detected.  Equivocal and low positive HSV-2 screens  (Index 0.91-5.00) may be false positive and are  reflexed to supplemental testing in accordance with  CDC guidelines.   RPR     Status: None   Collection Time: 03/03/22  5:12 PM  Result Value Ref Range   RPR Ser Ql Non Reactive Non Reactive  GC/Chlamydia Probe Amp     Status: None   Collection Time: 03/03/22  5:18 PM   Specimen: Urine   UR  Result Value Ref Range   Chlamydia trachomatis, NAA Negative Negative   Neisseria Gonorrhoeae by PCR Negative Negative    Radiology: US Pelvic Complete With Transvaginal  Result Date: 02/27/2022 CLINICAL DATA:  Vaginal bleeding IUD EXAM: TRANSABDOMINAL AND TRANSVAGINAL ULTRASOUND OF PELVIS TECHNIQUE: Both transabdominal and transvaginal ultrasound examinations of the pelvis were performed. Transabdominal  technique was performed for global imaging of the pelvis including uterus, ovaries, adnexal regions, and pelvic cul-de-sac. It was necessary to proceed with endovaginal exam following the transabdominal exam to visualize the uterus endometrium ovaries. COMPARISON:  None Available. FINDINGS: Uterus Measurements: 8.3 x 4.8 x 4.1 cm = volume: 86 mL. No fibroids or other mass visualized. Endometrium Poorly visible due to intrauterine device. IUD visualized within the uterine corpus and fundus. Right ovary Measurements: 4 x 3.8 x 3.2 cm = volume: 26 mL. Probable measuring 2.6 x 2.2 x 2.4 cm, no follow-up imaging recommended. Left ovary Not seen Other findings No abnormal free fluid. IMPRESSION: 1. IUD appears grossly appropriate in position by sonography. 2. Nonvisualized left ovary Electronically Signed   By: Jasmine Pang M.D.   On: 02/27/2022 16:48    No results found.  US Pelvic Complete With Transvaginal  Result Date: 02/27/2022 CLINICAL DATA:  Vaginal bleeding IUD EXAM: TRANSABDOMINAL AND TRANSVAGINAL ULTRASOUND OF PELVIS TECHNIQUE: Both transabdominal and transvaginal ultrasound examinations of the pelvis were performed. Transabdominal technique was performed for global imaging of the pelvis including uterus, ovaries, adnexal regions, and pelvic cul-de-sac. It was necessary to proceed with endovaginal exam following the transabdominal exam to visualize the uterus endometrium ovaries. COMPARISON:  None Available. FINDINGS: Uterus Measurements: 8.3 x 4.8 x 4.1 cm = volume: 86 mL. No fibroids or other mass visualized. Endometrium Poorly visible due to intrauterine device. IUD visualized within the uterine corpus and fundus. Right ovary Measurements: 4 x 3.8 x 3.2 cm = volume: 26 mL. Probable measuring 2.6 x 2.2 x 2.4 cm, no follow-up imaging recommended. Left ovary Not seen Other findings No abnormal free fluid. IMPRESSION: 1. IUD appears grossly appropriate in position by sonography. 2. Nonvisualized left  ovary Electronically Signed   By: Jasmine Pang M.D.   On: 02/27/2022 16:48      Assessment and Plan: Patient Active Problem List   Diagnosis Date Noted   Vaginal lesion 03/03/2022   IUD threads lost 02/25/2022   Osteoarthritis of knee 02/16/2022   OSA on CPAP 12/01/2021   Morbid obesity (HCC) 12/01/2021   OSA (obstructive sleep apnea) 05/12/2021   Class 3 severe obesity with serious comorbidity and body mass index (BMI) of 50.0 to 59.9 in adult [facility]) 04/24/2021   Hyperlipidemia 03/17/2021   History  of 2019 novel coronavirus disease (COVID-19) 11/05/2020   Insulin resistance 04/03/2020   Vitamin D deficiency 04/03/2020   Mild episode of recurrent major depressive disorder ([place]) 04/28/2019   Generalized anxiety disorder 04/28/2019   GERD (gastroesophageal reflux disease) 04/28/2019   Chronic low back pain 05/07/2017   Spondylosis of lumbar region without myelopathy or radiculopathy 03/12/2015    1. OSA on CPAP The patient does tolerate PAP and reports  benefit from PAP use. The patient was reminded how to clean equipment and advised to replace supplies routinely. The patient was also counselled on weight loss. Bariatric surgery is scheduled for this week.. The compliance is excellent. The AHI is 1.7.   OSA- CPAP continues to be medically necessary to treat this patient's OSA.  Continue with excellent compliance with pap. F/u one year.    2. CPAP use counseling CPAP Counseling: had a lengthy discussion with the patient regarding the importance of PAP therapy in management of the sleep apnea. Patient appears to understand the risk factor reduction and also understands the risks associated with untreated sleep apnea. Patient will try to make a good faith effort to remain compliant with therapy. Also instructed the patient on proper cleaning of the device including the water must be changed daily if possible and use of distilled water is preferred. Patient understands that the machine  should be regularly cleaned with appropriate recommended cleaning solutions that do not damage the PAP machine for example given white vinegar and water rinses. Other methods such as ozone treatment may not be as good as these simple methods to achieve cleaning.   3. Morbid obesity ([place]) Obesity Counseling: Had a lengthy discussion regarding patients BMI and weight issues. Patient was instructed on portion control as well as increased activity. Also discussed caloric restrictions with trying to maintain intake less than 2000 Kcal. Discussions were made in accordance with the 5As of weight management. Simple actions such as not eating late and if able to, taking a walk is suggested.    General Counseling: I have discussed the findings of the evaluation and examination with Renia.  I have also discussed any further diagnostic evaluation thatmay be needed or ordered today. Lakyia verbalizes understanding of the findings of todays visit. We also reviewed her medications today and discussed drug interactions and side effects including but not limited excessive drowsiness and altered mental states. We also discussed that there is always a risk not just to her but also people around her. she has been encouraged to call the office with any questions or concerns that should arise related to todays visit.  No orders of the defined types were placed in this encounter.       I have personally obtained a history, examined the patient, evaluated laboratory and imaging results, formulated the assessment and plan and placed orders. This patient was seen today by Tressie Ellis, PA-C in collaboration with Dr. Devona Konig.   Allyne Gee, MD [facility] Diplomate ABMS Pulmonary Critical Care Medicine and Sleep Medicine

## 2022-03-25 DIAGNOSIS — G4733 Obstructive sleep apnea (adult) (pediatric): Secondary | ICD-10-CM | POA: Diagnosis not present

## 2022-03-25 DIAGNOSIS — K449 Diaphragmatic hernia without obstruction or gangrene: Secondary | ICD-10-CM | POA: Diagnosis not present

## 2022-03-25 DIAGNOSIS — K219 Gastro-esophageal reflux disease without esophagitis: Secondary | ICD-10-CM | POA: Diagnosis not present

## 2022-03-25 HISTORY — PX: BARIATRIC SURGERY: SHX1103

## 2022-04-04 DIAGNOSIS — G4733 Obstructive sleep apnea (adult) (pediatric): Secondary | ICD-10-CM | POA: Diagnosis not present

## 2022-04-05 NOTE — Patient Instructions (Signed)
Cold Sore  A cold sore, also called a fever blister, is a small, fluid-filled sore that forms inside of the mouth or on the lips, gums, nose, chin, or cheeks. Cold sores can spread to other parts of the body, such as the eyes, fingers, or genitals. Cold sores can spread from person to person (are contagious) until the sores crust over completely. Most cold sores go away within 2 weeks. What are the causes? Cold sores are caused by a virus (herpes simplex virus type 1, HSV-1). The virus can spread from person to person through close contact, such as through: Kissing. Touching the affected area. Sharing personal items such as lip balm, razors, a drinking glass, or eating utensils. What increases the risk? Being tired, stressed, or sick. Having your period (menstruating). Being pregnant. Taking certain medicines. Being out in cold weather or getting too much sun. What are the signs or symptoms? Symptoms of a cold sore go through different stages: Tingling, itching, or burning is felt 1-2 days before the cold sore appears. Fluid-filled blisters appear on the lips, inside the mouth, on the nose, or on the cheeks. The blisters start to ooze clear fluid. The blisters dry up, and a yellow crust appears in their place. The crust falls off. In some cases, other symptoms can develop along with cold sores. These can include: Fever. Sore throat. Headache. Muscle aches. Swollen neck glands. How is this treated? There is no cure for cold sores or the virus that causes them. There is also no vaccine to prevent the virus. Most cold sores go away on their own without treatment within 2 weeks. Your doctor may prescribe medicines to: Help with pain. Keep the virus from growing. Help you heal faster. Medicines may be in the form of creams, gels, pills, or a shot. Follow these instructions at home: Medicines Take or apply over-the-counter and prescription medicines only as told by your doctor. Use a  cotton-tip swab to apply creams or gels to your sores. Ask your doctor if you can take lysine supplements. These may help with healing. Sore care  Do not touch the sores or pick the scabs. Wash your hands often with soap and water for at least 20 seconds. Do not touch your eyes without washing your hands first. Keep the sores clean and dry. If told, put ice on the sores. To do this: Put ice in a plastic bag. Place a towel between your skin and the bag. Leave the ice on for 20 minutes, 2-3 times a day. Take off the ice if your skin turns bright red. This is very important. If you cannot feel pain, heat, or cold, you have a greater risk of damage to the area. Eating and drinking Eat a soft, bland diet. Avoid eating hot, cold, or salty foods. These can hurt your mouth. Use a straw if it hurts to drink out of a glass. Eat foods that have a lot of lysine in them. These include meat, fish, and dairy products. Avoid sugary foods, chocolates, nuts, and grains. These foods have a high amount of a substance (arginine) that can cause the virus to grow. Lifestyle Do not kiss, have oral sex, or share personal items until your sores heal. Stress, poor sleep, and being out in the sun can trigger a cold sore. Make sure you: Do activities that help you relax, such as deep breathing exercises or meditation. Get enough sleep. Put sunscreen on your lips before you go out in the sun. Contact a   doctor if: You have symptoms for more than 2 weeks. You have pus coming from the sores. You have redness that is spreading. You have pain or irritation in your eye. You get sores on your genitals. Your sores do not heal within 2 weeks. You get cold sores often. Get help right away if: You have a fever and your symptoms suddenly get worse. You have a headache and confusion. You have tiredness (fatigue). You do not want to eat as much as normal (loss of appetite). You have a stiff neck or are sensitive to  light. Summary A cold sore is a small, fluid-filled sore that forms inside of the mouth or on the lips, gums, nose, chin, or cheeks. Cold sores can spread from person to person (are contagious) until the sores crust over completely. Most cold sores go away within 2 weeks. Wash your hands often. Do not touch your eyes without washing your hands first. Do not kiss, have oral sex, or share personal items until your sores heal. Contact a doctor if your sores do not heal within 2 weeks. This information is not intended to replace advice given to you by your health care provider. Make sure you discuss any questions you have with your health care provider. Document Revised: 07/02/2021 Document Reviewed: 07/02/2021 Elsevier Patient Education  2023 [employer] Inc.  

## 2022-04-06 ENCOUNTER — Encounter: Payer: Self-pay | Admitting: Nurse Practitioner

## 2022-04-06 ENCOUNTER — Ambulatory Visit: Payer: Medicaid Other | Admitting: Nurse Practitioner

## 2022-04-06 VITALS — BP 129/82 | HR 89 | Temp 98.0°F | Ht 69.0 in | Wt 348.8 lb

## 2022-04-06 DIAGNOSIS — Z1159 Encounter for screening for other viral diseases: Secondary | ICD-10-CM

## 2022-04-06 DIAGNOSIS — F330 Major depressive disorder, recurrent, mild: Secondary | ICD-10-CM | POA: Diagnosis not present

## 2022-04-06 DIAGNOSIS — Z9884 Bariatric surgery status: Secondary | ICD-10-CM | POA: Diagnosis not present

## 2022-04-06 DIAGNOSIS — Z6841 Body Mass Index (BMI) 40.0 and over, adult: Secondary | ICD-10-CM | POA: Diagnosis not present

## 2022-04-06 DIAGNOSIS — F411 Generalized anxiety disorder: Secondary | ICD-10-CM

## 2022-04-06 DIAGNOSIS — N898 Other specified noninflammatory disorders of vagina: Secondary | ICD-10-CM | POA: Diagnosis not present

## 2022-04-06 MED ORDER — BUSPIRONE HCL 15 MG PO TABS: 15.0000 mg | ORAL_TABLET | Freq: Two times a day (BID) | ORAL | 4 refills | Status: DC

## 2022-04-06 NOTE — Assessment & Plan Note (Signed)
Continue to collaborate with [facility].

## 2022-04-06 NOTE — Assessment & Plan Note (Signed)
Chronic, ongoing.  Denies SI/HI.  Continue Wellbutrin 300 MG XL daily, Effexor XR 75 MG BID, but increase Buspar to 15 MG BID to see if will benefit anxiety and increased irritability.  Return in September.

## 2022-04-06 NOTE — Assessment & Plan Note (Signed)
Refer to depression plan of care. 

## 2022-04-06 NOTE — Progress Notes (Signed)
BP 129/82   Pulse 89   Temp 98 F (36.7 C) (Oral)   Ht 5\' 9"  (1.753 m)   Wt (!) 348 lb 12.8 oz (158.2 kg)   SpO2 99%   BMI 51.51 kg/m    Subjective:    Patient ID: Rachel Cooley, female    DOB: Jun 28, 1981, 41 y.o.   MRN: [id number]  HPI: Rachel Cooley is a 41 y.o. female  Chief Complaint  Patient presents with   Fussy    Patient says she is here for frustrations and irritated with her kids lately and wonders if her stress levels are causing the weight gain.    In need of labs repeated today for HSV.  She reports her vaginal issues are not resolved.  DEPRESSION Currently taking Wellbutrin XL 300 MG daily and Effexor XR 75 MG daily + Buspar 5 MG BID -- has not take Buspar since surgery.  Reports having increased irritability recently.  Had recent gastrectomy on 03/25/22 at [facility] -- has lost approx 20 lbs, she is adjusting to portion size.  Is walking every day with her children and by herself.    When her significant other is home she has less stressors.   Mood status: exacerbated Satisfied with current treatment?: yes Symptom severity: moderate  Duration of current treatment : chronic Side effects: no Medication compliance: good compliance Psychotherapy/counseling: none Depressed mood: no Anxious mood: no Anhedonia: no Significant weight loss or gain: no Insomnia: yes hard to fall asleep Fatigue: no Feelings of worthlessness or guilt: no Impaired concentration/indecisiveness: no Suicidal ideations: no Hopelessness: no Crying spells: yes with her husband going back to work    04/06/2022    4:31 PM 02/25/2022    2:56 PM 11/07/2021   11:39 AM 02/28/2021   10:59 AM 01/31/2021   10:08 AM  Depression screen PHQ 2/9  Decreased Interest 2 1 1 1 1   Down, Depressed, Hopeless 2 1 1 1 2   PHQ - 2 Score 4 2 2 2 3   Altered sleeping 0 1 0 1 2  Tired, decreased energy 1 1 1 1 2   Change in appetite 0 1 2 0 2  Feeling bad or failure about yourself  0 1 0 0 1  Trouble  concentrating 0 0 0 0 1  Moving slowly or fidgety/restless 0 0 0 0 0  Suicidal thoughts 0 0 0 0 0  PHQ-9 Score 5 6 5 4 11   Difficult doing work/chores Not difficult at all   Not difficult at all       04/06/2022    4:32 PM 02/25/2022    2:56 PM 11/07/2021   11:39 AM 02/28/2021   11:00 AM  GAD 7 : Generalized Anxiety Score  Nervous, Anxious, on Edge 1 1 1 1   Control/stop worrying 0 0 0 1  Worry too much - different things 0 0 0 1  Trouble relaxing 1 0 0 0  Restless 0 0 0 0  Easily annoyed or irritable 2 1 1  0  Afraid - awful might happen 0 0 0 0  Total GAD 7 Score 4 2 2 3   Anxiety Difficulty Not difficult at all  Somewhat difficult Not difficult at all     Relevant past medical, surgical, family and social history reviewed and updated as indicated. Interim medical history since our last visit reviewed. Allergies and medications reviewed and updated.  Review of Systems  Constitutional:  Negative for activity change, appetite change, diaphoresis, fatigue and fever.  Respiratory:  Negative for cough, chest tightness and shortness of breath.   Cardiovascular:  Negative for chest pain, palpitations and leg swelling.  Gastrointestinal: Negative.   Neurological: Negative.   Psychiatric/Behavioral:  Positive for decreased concentration and sleep disturbance (occasional). Negative for self-injury and suicidal ideas. The patient is not nervous/anxious.     Per HPI unless specifically indicated above     Objective:    BP 129/82   Pulse 89   Temp 98 F (36.7 C) (Oral)   Ht 5\' 9"  (1.753 m)   Wt (!) 348 lb 12.8 oz (158.2 kg)   SpO2 99%   BMI 51.51 kg/m   Wt Readings from Last 3 Encounters:  04/06/22 (!) 348 lb 12.8 oz (158.2 kg)  03/03/22 (!) 366 lb (166 kg)  02/25/22 (!) 366 lb 9.6 oz (166.3 kg)    Physical Exam Vitals and nursing note reviewed.  Constitutional:      General: She is awake. She is not in acute distress.    Appearance: She is well-developed and well-groomed. She  is morbidly obese. She is not ill-appearing or toxic-appearing.  HENT:     Head: Normocephalic.     Right Ear: Hearing normal.     Left Ear: Hearing normal.  Eyes:     General: Lids are normal.        Right eye: No discharge.        Left eye: No discharge.     Conjunctiva/sclera: Conjunctivae normal.     Pupils: Pupils are equal, round, and reactive to light.  Neck:     Thyroid: No thyromegaly.     Vascular: No carotid bruit.  Cardiovascular:     Rate and Rhythm: Normal rate and regular rhythm.     Heart sounds: Normal heart sounds. No murmur heard.    No gallop.  Pulmonary:     Effort: Pulmonary effort is normal. No accessory muscle usage or respiratory distress.     Breath sounds: Normal breath sounds.  Abdominal:     General: Bowel sounds are normal.     Palpations: Abdomen is soft.  Musculoskeletal:     Cervical back: Normal range of motion and neck supple.     Right lower leg: No edema.     Left lower leg: No edema.  Skin:    General: Skin is warm and dry.  Neurological:     Mental Status: She is alert and oriented to person, place, and time.  Psychiatric:        Attention and Perception: Attention normal.        Mood and Affect: Mood normal.        Speech: Speech normal.        Behavior: Behavior normal. Behavior is cooperative.        Thought Content: Thought content normal.     Results for orders placed or performed in visit on 03/03/22  GC/Chlamydia Probe Amp   Specimen: Urine   UR  Result Value Ref Range   Chlamydia trachomatis, NAA Negative Negative   Neisseria Gonorrhoeae by PCR Negative Negative  HIV Antibody (routine testing w rflx)  Result Value Ref Range   HIV Screen 4th Generation wRfx Non Reactive Non Reactive  HSV(herpes simplex vrs) 1+2 ab-IgG  Result Value Ref Range   HSV 1 Glycoprotein G Ab, IgG <0.91 0.00 - 0.90 index   HSV 2 IgG, Type Spec <0.91 0.00 - 0.90 index  RPR  Result Value Ref Range   RPR Ser Ql  Non Reactive Non Reactive       Assessment & Plan:   Problem List Items Addressed This Visit       Genitourinary   RESOLVED: Vaginal lesion   Relevant Orders   HIV-1/2 AB - differentiation     Other   Class 3 severe obesity with serious comorbidity and body mass index (BMI) of 50.0 to 59.9 in adult [facility])    Is working with [employer], continue this collaboration.  Recent notes and labs reviewed.  Recent gastrectomy on 03/25/22.      Generalized anxiety disorder    Refer to depression plan of care.      Relevant Medications   busPIRone (BUSPAR) 15 MG tablet   Mild episode of recurrent major depressive disorder (HCC) - Primary    Chronic, ongoing.  Denies SI/HI.  Continue Wellbutrin 300 MG XL daily, Effexor XR 75 MG BID, but increase Buspar to 15 MG BID to see if will benefit anxiety and increased irritability.  Return in September.      Relevant Medications   busPIRone (BUSPAR) 15 MG tablet   S/P laparoscopic sleeve gastrectomy    Continue to collaborate with [facility].      Other Visit Diagnoses     Need for hepatitis C screening test       Hep C screen on labs today per guidelines for one time screening, discussed with patient.   Relevant Orders   Hepatitis C antibody        Follow up plan: Return for as scheduled 06/09/22.

## 2022-04-06 NOTE — Assessment & Plan Note (Signed)
Is working with [employer], continue this collaboration.  Recent notes and labs reviewed.  Recent gastrectomy on 03/25/22.

## 2022-04-09 ENCOUNTER — Encounter: Payer: Self-pay | Admitting: Nurse Practitioner

## 2022-04-10 LAB — HIV-1/2 AB - DIFFERENTIATION
HIV 1 Ab: NONREACTIVE
HIV 2 Ab: NONREACTIVE
NOTE (HIV CONF MULTIP: NEGATIVE

## 2022-04-10 LAB — HIV-1/HIV-2 QUALITATIVE RNA
Final Interpretation: NEGATIVE
HIV-1 RNA, Qualitative: NONREACTIVE
HIV-2 RNA, Qualitative: NONREACTIVE

## 2022-04-10 LAB — HEPATITIS C ANTIBODY: Hep C Virus Ab: NONREACTIVE

## 2022-04-10 NOTE — Progress Notes (Signed)
Contacted via MyChart   We are working on adding on HSV labs.  :)

## 2022-04-10 NOTE — Addendum Note (Signed)
Addended by: Aura Dials T on: 04/10/2022 08:17 AM   Modules accepted: Orders

## 2022-04-11 LAB — HSV(HERPES SIMPLEX VRS) I + II AB-IGG
HSV 1 Glycoprotein G Ab, IgG: 14.5 index — ABNORMAL HIGH (ref 0.00–0.90)
HSV 2 IgG, Type Spec: <0.91 index (ref 0.00–0.90)

## 2022-04-11 LAB — SPECIMEN STATUS REPORT

## 2022-04-11 NOTE — Progress Notes (Signed)
Contacted via MyChart   Good afternoon Rachel Cooley, your herpes testing returned.  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  Great news!!  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  Any questions? Keep being awesome!!  Thank you for allowing me to participate in your care.  I appreciate you. Kindest regards, Kylor Valverde

## 2022-04-20 ENCOUNTER — Other Ambulatory Visit (HOSPITAL_COMMUNITY)
Admission: RE | Admit: 2022-04-20 | Discharge: 2022-04-20 | Disposition: A | Discharge status: Home / Self Care | Payer: Medicaid Other | Source: Ambulatory Visit | Attending: Obstetrics & Gynecology | Admitting: Obstetrics & Gynecology

## 2022-04-20 ENCOUNTER — Encounter: Payer: Self-pay | Admitting: Obstetrics & Gynecology

## 2022-04-20 ENCOUNTER — Ambulatory Visit: Payer: Medicaid Other | Admitting: Obstetrics & Gynecology

## 2022-04-20 VITALS — BP 124/84 | Ht 69.0 in | Wt 342.2 lb

## 2022-04-20 DIAGNOSIS — Z124 Encounter for screening for malignant neoplasm of cervix: Secondary | ICD-10-CM | POA: Diagnosis not present

## 2022-04-20 DIAGNOSIS — Z01419 Encounter for gynecological examination (general) (routine) without abnormal findings: Secondary | ICD-10-CM

## 2022-04-20 MED ORDER — VALACYCLOVIR HCL 1 G PO TABS: 1000.0000 mg | ORAL_TABLET | Freq: Two times a day (BID) | ORAL | 3 refills | Status: DC

## 2022-04-20 NOTE — Progress Notes (Signed)
Subjective:    Rachel Cooley is a 41 y.o. single P2 (twin 3 yo daughters)  who presents for an annual exam. She has not been here since 2019.  The patient has no complaints today. She does report that she dealt with a new onset severe vulvar pain situation aout a month ago. At that time her HSV1 igG was negative and repeat in 2 weeks was elevated at 14. The patient is sexually active. GYN screening history: last pap: was normal. She denies a h/o abnormal paps and had the Gardasil series in the past. The patient wears seatbelts: yes. The patient participates in regular exercise: yes. Has the patient ever been transfused or tattooed?: yes. The patient reports that there is not domestic violence in her life.   Menstrual History: OB History     Gravida  1   Para      Term      Preterm      AB      Living  2      SAB      IAB      Ectopic      Multiple  1   Live Births              Menarche age: 8 No LMP recorded. (Menstrual status: IUD). Period Cycle (Days): -2 Period Duration (Days): 5-7 Period Pattern: Regular Menstrual Flow: Light, Heavy Dysmenorrhea: (!) Mild Dysmenorrhea Symptoms: Cramping, Other (Comment) (lower back pain)  The following portions of the patient's history were reviewed and updated as appropriate: allergies, current medications, past family history, past medical history, past social history, past surgical history, and problem list.  Review of Systems Pertinent items are noted in HPI.  She denies a FH of breast/gyn/colon cancer. She and her partner have been monogamous for over 14 years and live together. She had a gastric sleeve procedure 3 weeks ago.    Objective:    BP 124/84   Ht 5\' 9"  (1.753 m)   Wt (!) 342 lb 3.2 oz (155.2 kg)   Breastfeeding No   BMI 50.53 kg/m   General Appearance:    Alert, cooperative, no distress, appears stated age  Head:    Normocephalic, without obvious abnormality, atraumatic  Eyes:    PERRL,  conjunctiva/corneas clear, EOM's intact, fundi    benign, both eyes  Ears:    Normal TM's and external ear canals, both ears  Nose:   Nares normal, septum midline, mucosa normal, no drainage    or sinus tenderness  Throat:   Lips, mucosa, and tongue normal; teeth and gums normal  Neck:   Supple, symmetrical, trachea midline, no adenopathy;    thyroid:  no enlargement/tenderness/nodules; no carotid   bruit or JVD  Back:     Symmetric, no curvature, ROM normal, no CVA tenderness  Lungs:     Clear to auscultation bilaterally, respirations unlabored  Chest Wall:    No tenderness or deformity   Heart:    Regular rate and rhythm, S1 and S2 normal, no murmur, rub   or gallop  Breast Exam:    No tenderness, masses, or nipple abnormality  Abdomen:     Soft, non-tender, bowel sounds active all four quadrants,    no masses, no organomegaly  Genitalia:     Normal female without lesion, discharge or tenderness, normal size and shape, anteverted, normal adnexal exam   Rectal:    Deferred  Extremities:   Extremities normal, atraumatic, no cyanosis or edema  Pulses:   2+ and symmetric all extremities  Skin:   Skin color, texture, turgor normal, no rashes or lesions  Lymph nodes:   Cervical, supraclavicular, and axillary nodes normal  Neurologic:   CNII-XII intact, normal strength, sensation and reflexes    throughout  .    Assessment:    Healthy female exam.  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   Plan:     Await pap smear results.

## 2022-04-23 LAB — CYTOLOGY - PAP
Comment: NEGATIVE
Diagnosis: NEGATIVE
High risk HPV: NEGATIVE

## 2022-04-24 DIAGNOSIS — G4733 Obstructive sleep apnea (adult) (pediatric): Secondary | ICD-10-CM | POA: Diagnosis not present

## 2022-04-25 ENCOUNTER — Encounter: Payer: Self-pay | Admitting: Obstetrics & Gynecology

## 2022-05-06 DIAGNOSIS — G4733 Obstructive sleep apnea (adult) (pediatric): Secondary | ICD-10-CM | POA: Diagnosis not present

## 2022-05-13 ENCOUNTER — Encounter (INDEPENDENT_AMBULATORY_CARE_PROVIDER_SITE_OTHER): Payer: Self-pay

## 2022-05-25 DIAGNOSIS — G4733 Obstructive sleep apnea (adult) (pediatric): Secondary | ICD-10-CM | POA: Diagnosis not present

## 2022-06-05 DIAGNOSIS — G4733 Obstructive sleep apnea (adult) (pediatric): Secondary | ICD-10-CM | POA: Diagnosis not present

## 2022-06-07 DIAGNOSIS — D509 Iron deficiency anemia, unspecified: Secondary | ICD-10-CM | POA: Insufficient documentation

## 2022-06-07 NOTE — Patient Instructions (Signed)

## 2022-06-09 ENCOUNTER — Ambulatory Visit (INDEPENDENT_AMBULATORY_CARE_PROVIDER_SITE_OTHER): Payer: Medicaid Other | Admitting: Nurse Practitioner

## 2022-06-09 ENCOUNTER — Encounter: Payer: Self-pay | Admitting: Nurse Practitioner

## 2022-06-09 VITALS — BP 132/82 | HR 81 | Temp 97.9°F | Wt 323.3 lb

## 2022-06-09 DIAGNOSIS — G4733 Obstructive sleep apnea (adult) (pediatric): Secondary | ICD-10-CM

## 2022-06-09 DIAGNOSIS — E782 Mixed hyperlipidemia: Secondary | ICD-10-CM

## 2022-06-09 DIAGNOSIS — Z0000 Encounter for general adult medical examination without abnormal findings: Secondary | ICD-10-CM

## 2022-06-09 DIAGNOSIS — F411 Generalized anxiety disorder: Secondary | ICD-10-CM | POA: Diagnosis not present

## 2022-06-09 DIAGNOSIS — K219 Gastro-esophageal reflux disease without esophagitis: Secondary | ICD-10-CM | POA: Diagnosis not present

## 2022-06-09 DIAGNOSIS — E8881 Metabolic syndrome: Secondary | ICD-10-CM | POA: Diagnosis not present

## 2022-06-09 DIAGNOSIS — E559 Vitamin D deficiency, unspecified: Secondary | ICD-10-CM | POA: Diagnosis not present

## 2022-06-09 DIAGNOSIS — Z9884 Bariatric surgery status: Secondary | ICD-10-CM | POA: Diagnosis not present

## 2022-06-09 DIAGNOSIS — Z6841 Body Mass Index (BMI) 40.0 and over, adult: Secondary | ICD-10-CM | POA: Diagnosis not present

## 2022-06-09 DIAGNOSIS — D508 Other iron deficiency anemias: Secondary | ICD-10-CM

## 2022-06-09 DIAGNOSIS — F330 Major depressive disorder, recurrent, mild: Secondary | ICD-10-CM | POA: Diagnosis not present

## 2022-06-09 MED ORDER — OMEPRAZOLE 40 MG PO CPDR: 40.0000 mg | DELAYED_RELEASE_CAPSULE | Freq: Every day | ORAL | 4 refills | Status: DC

## 2022-06-09 MED ORDER — BUSPIRONE HCL 15 MG PO TABS: 15.0000 mg | ORAL_TABLET | Freq: Two times a day (BID) | ORAL | 4 refills | Status: DC | PRN

## 2022-06-09 NOTE — Assessment & Plan Note (Signed)
Continue to collaborate with [facility].

## 2022-06-09 NOTE — Assessment & Plan Note (Signed)
Chronic, ongoing.  Denies SI/HI.  Continue Wellbutrin 300 MG XL daily and Effexor XR 75 MG BID, change Buspar to as needed only.  Discussed at length with patient.

## 2022-06-09 NOTE — Assessment & Plan Note (Signed)
Chronic, stable.  Recommend continue supplement and recheck level today.  Adjust regimen as needed.

## 2022-06-09 NOTE — Assessment & Plan Note (Addendum)
Chronic, stable -- post sleeve placement.  Continue Omeprazole daily and consider reduction to discontinuation in new year.  Discussed the risks and benefits of long term PPI use including but not limited to bone loss, chronic kidney disease, infections, low magnesium.  Will aim to use at the lowest dose for the shortest period of time.  Mag level today.

## 2022-06-09 NOTE — Assessment & Plan Note (Signed)
Is working with [employer], continue this collaboration.  Recent notes and labs reviewed.  Recent gastrectomy on 03/25/22.

## 2022-06-09 NOTE — Assessment & Plan Note (Signed)
Noted on past labs with [facility] -- check A1c today.

## 2022-06-09 NOTE — Progress Notes (Signed)
BP 132/82   Pulse 81   Temp 97.9 F (36.6 C) (Oral)   Wt (!) 323 lb 4.8 oz (146.6 kg)   SpO2 (!) 89%   BMI 47.74 kg/m    Subjective:    Patient ID: Rachel Cooley, female    DOB: 17-Jan-1981, 41 y.o.   MRN: [id number]  HPI: Rachel Cooley is a 41 y.o. female presenting on 06/09/2022 for comprehensive medical examination. Current medical complaints include:none  She currently lives with: husband and children Menopausal Symptoms: no  Had gastric sleeve performed on 03/25/22 at [facility], with recent visit on 06/03/22 with dietician + follows up with nurse practitioner upcoming.  Post op anemia present monitoring.  Continues on supplements + Vitamin D supplement for history of lows.  Continues on Omeprazole.  DEPRESSION Continues on Wellbutrin and Effexor.  Minimally taking Buspar. Mood status: stable Satisfied with current treatment?: yes Symptom severity: moderate  Duration of current treatment : chronic Side effects: no Medication compliance: good compliance Psychotherapy/counseling: yes in the past Depressed mood: no Anxious mood: no Anhedonia: no Significant weight loss or gain: no Insomnia: none Fatigue: no Feelings of worthlessness or guilt: no Impaired concentration/indecisiveness: no Suicidal ideations: no Hopelessness: no Crying spells: no    06/09/2022    4:12 PM 04/06/2022    4:31 PM 02/25/2022    2:56 PM 11/07/2021   11:39 AM 02/28/2021   10:59 AM  Depression screen PHQ 2/9  Decreased Interest 1 2 1 1 1   Down, Depressed, Hopeless 0 2 1 1 1   PHQ - 2 Score 1 4 2 2 2   Altered sleeping 1 0 1 0 1  Tired, decreased energy 1 1 1 1 1   Change in appetite 0 0 1 2 0  Feeling bad or failure about yourself  0 0 1 0 0  Trouble concentrating 0 0 0 0 0  Moving slowly or fidgety/restless 0 0 0 0 0  Suicidal thoughts 0 0 0 0 0  PHQ-9 Score 3 5 6 5 4   Difficult doing work/chores Not difficult at all Not difficult at all   Not difficult at all       06/09/2022    4:13 PM  04/06/2022    4:32 PM 02/25/2022    2:56 PM 11/07/2021   11:39 AM  GAD 7 : Generalized Anxiety Score  Nervous, Anxious, on Edge 1 1 1 1   Control/stop worrying 0 0 0 0  Worry too much - different things 0 0 0 0  Trouble relaxing 0 1 0 0  Restless 0 0 0 0  Easily annoyed or irritable 1 2 1 1   Afraid - awful might happen 0 0 0 0  Total GAD 7 Score 2 4 2 2   Anxiety Difficulty Not difficult at all Not difficult at all  Somewhat difficult      05/20/2021    9:03 AM 11/07/2021   11:39 AM 02/25/2022    2:54 PM 06/09/2022    4:12 PM 06/09/2022    4:50 PM  Fall Risk  Falls in the past year?  0 0 0 0  Was there an injury with Fall?  0 0 0 0  Fall Risk Category Calculator  0 0 0 0  Fall Risk Category  Low Low Low Low  Patient Fall Risk Level Low fall risk Low fall risk Low fall risk Low fall risk Low fall risk  Patient at Risk for Falls Due to  No Fall Risks No Fall Risks No  Fall Risks No Fall Risks  Fall risk Follow up  Falls evaluation completed Falls evaluation completed Falls evaluation completed Falls prevention discussed    Functional Status Survey: Is the patient deaf or have difficulty hearing?: No Does the patient have difficulty seeing, even when wearing glasses/contacts?: No Does the patient have difficulty concentrating, remembering, or making decisions?: No Does the patient have difficulty walking or climbing stairs?: No Does the patient have difficulty dressing or bathing?: No Does the patient have difficulty doing errands alone such as visiting a doctor's office or shopping?: No    Past Medical History:  Past Medical History:  Diagnosis Date   Anxiety    Back muscle spasm    Back pain    Chronic low back pain    Depression    GERD (gastroesophageal reflux disease)    GERD (gastroesophageal reflux disease)    Maternal morbid obesity, antepartum ([place])    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   cetirizine (ZYRTEC) 10 MG tablet Take by mouth.   Cholecalciferol 1.25 MG (50000 UT) capsule cholecalciferol (vitamin D3) 1,250 mcg (50,000 unit) capsule  TAKE 1 TABLET BY MOUTH ONE TIME PER WEEK   docusate sodium (COLACE) 100 MG capsule Take 100 mg by mouth at bedtime.   paragard intrauterine copper IUD IUD by Intrauterine route.   ursodiol (ACTIGALL) 300 MG capsule Take 300 mg by mouth 2 (two) times daily.   venlafaxine XR (EFFEXOR-XR) 75 MG 24 hr capsule TAKE 1 CAPSULE BY MOUTH TWICE A DAY   No current facility-administered medications on file prior to visit.    Allergies:  No Known Allergies  Social History:  Social History   Socioeconomic History   Marital status: Single    Spouse name: Not on file   Number of children: 2   Years of education: Not on file   Highest education level: Not on file  Occupational History   Not on file  Tobacco Use   Smoking status: Former    Types: Cigarettes   Smokeless tobacco: Never   Tobacco comments:    stopped with +UPT  Vaping Use   Vaping Use: Never used  Substance and Sexual Activity   Alcohol use: No   Drug use: No   Sexual activity: Yes    Partners: Male    Birth control/protection: I.U.D.    Comment: paraguard patient reports placed May 2019  Other Topics Concern   Not on file  Social History Narrative   Not on file   Social Determinants of Health   Financial Resource Strain: Low Risk  (04/28/2019)   Overall Financial Resource Strain (CARDIA)    Difficulty of Paying Living Expenses: Not very hard  Food Insecurity: No Food Insecurity (04/28/2019)    Hunger Vital Sign    Worried About Running Out of Food in the Last Year: Never true    Ran Out of Food in the Last Year: Never true  Transportation Needs: No Transportation Needs (04/28/2019)  PRAPARE - Hydrologist (Medical): No    Lack of Transportation (Non-Medical): No  Physical Activity: Sufficiently Active (04/28/2019)   Exercise Vital Sign    Days of Exercise per Week: 5 days    Minutes of Exercise per Session: 30 min  Stress: Stress Concern Present (04/28/2019)   [place]    Feeling of Stress : To some extent  Social Connections: Unknown (04/28/2019)   Social Connection and Isolation Panel [NHANES]    Frequency of Communication with Friends and Family: Three times a week    Frequency of Social Gatherings with Friends and Family: Three times a week    Attends Religious Services: Not asked    Active Member of Clubs or Organizations: Not on file    Attends Club or Organization Meetings: Not on file    Marital Status: Married  Intimate Partner Violence: Not At Risk (04/28/2019)   Humiliation, Afraid, Rape, and Kick questionnaire    Fear of Current or Ex-Partner: No    Emotionally Abused: No    Physically Abused: No    Sexually Abused: No   Social History   Tobacco Use  Smoking Status Former   Types: Cigarettes  Smokeless Tobacco Never  Tobacco Comments   stopped with +UPT   Social History   Substance and Sexual Activity  Alcohol Use No    Family History:  Family History  Problem Relation Age of Onset   Hypertension Mother    Hyperlipidemia Mother    Arthritis Mother    Diabetes Mother    Depression Mother    Anxiety disorder Mother    Obesity Mother    Hypertension Father    Arthritis Father    Heart disease Father    Obesity Father    Brain cancer Maternal Grandmother    Lung cancer Maternal Grandfather    Brain cancer Paternal Grandfather    Stroke Neg Hx      Past medical history, surgical history, medications, allergies, family history and social history reviewed with patient today and changes made to appropriate areas of the chart.   ROS All other ROS negative except what is listed above and in the HPI.      Objective:    BP 132/82   Pulse 81   Temp 97.9 F (36.6 C) (Oral)   Wt (!) 323 lb 4.8 oz (146.6 kg)   SpO2 (!) 89%   BMI 47.74 kg/m   Wt Readings from Last 3 Encounters:  06/09/22 (!) 323 lb 4.8 oz (146.6 kg)  04/20/22 (!) 342 lb 3.2 oz (155.2 kg)  04/06/22 (!) 348 lb 12.8 oz (158.2 kg)    Physical Exam Vitals and nursing note reviewed.  Constitutional:      General: She is awake. She is not in acute distress.    Appearance: She is well-developed and well-groomed. She is obese. She is not ill-appearing or toxic-appearing.  HENT:     Head: Normocephalic and atraumatic.     Right Ear: Hearing, tympanic membrane, ear canal and external ear normal. No drainage.     Left Ear: Hearing, tympanic membrane, ear canal and external ear normal. No drainage.     Nose: Nose normal.     Right Sinus: No maxillary sinus tenderness or frontal sinus tenderness.     Left Sinus: No maxillary sinus tenderness or frontal sinus tenderness.     Mouth/Throat:     Mouth: Mucous membranes are moist.  Pharynx: Oropharynx is clear. Uvula midline. No pharyngeal swelling, oropharyngeal exudate or posterior oropharyngeal erythema.  Eyes:     General: Lids are normal.        Right eye: No discharge.        Left eye: No discharge.     Extraocular Movements: Extraocular movements intact.     Conjunctiva/sclera: Conjunctivae normal.     Pupils: Pupils are equal, round, and reactive to light.     Visual Fields: Right eye visual fields normal and left eye visual fields normal.  Neck:     Thyroid: No thyromegaly.     Vascular: No carotid bruit.     Trachea: Trachea normal.  Cardiovascular:     Rate and Rhythm: Normal rate and regular rhythm.      Heart sounds: Normal heart sounds. No murmur heard.    No gallop.  Pulmonary:     Effort: Pulmonary effort is normal. No accessory muscle usage or respiratory distress.     Breath sounds: Normal breath sounds.  Abdominal:     General: Bowel sounds are normal.     Palpations: Abdomen is soft. There is no hepatomegaly or splenomegaly.     Tenderness: There is no abdominal tenderness.  Musculoskeletal:        General: Normal range of motion.     Cervical back: Normal range of motion and neck supple.     Right lower leg: No edema.     Left lower leg: No edema.  Lymphadenopathy:     Head:     Right side of head: No submental, submandibular, tonsillar, preauricular or posterior auricular adenopathy.     Left side of head: No submental, submandibular, tonsillar, preauricular or posterior auricular adenopathy.     Cervical: No cervical adenopathy.  Skin:    General: Skin is warm and dry.     Capillary Refill: Capillary refill takes less than 2 seconds.     Findings: No rash.  Neurological:     Mental Status: She is alert and oriented to person, place, and time.     Gait: Gait is intact.     Deep Tendon Reflexes: Reflexes are normal and symmetric.     Reflex Scores:      Brachioradialis reflexes are 2+ on the right side and 2+ on the left side.      Patellar reflexes are 2+ on the right side and 2+ on the left side. Psychiatric:        Attention and Perception: Attention normal.        Mood and Affect: Mood normal.        Speech: Speech normal.        Behavior: Behavior normal. Behavior is cooperative.        Thought Content: Thought content normal.        Judgment: Judgment normal.    Results for orders placed or performed in visit on 04/20/22  Cytology - PAP( [place])  Result Value Ref Range   High risk HPV Negative    Adequacy      Satisfactory for evaluation; transformation zone component PRESENT.   Diagnosis      - Negative for intraepithelial lesion or malignancy (NILM)    Comment Normal Reference Range HPV - Negative       Assessment & Plan:   Problem List Items Addressed This Visit       Digestive   GERD (gastroesophageal reflux disease)    Chronic, stable -- post sleeve placement.  Continue Omeprazole daily and consider reduction to discontinuation in new year.  Discussed the risks and benefits of long term PPI use including but not limited to bone loss, chronic kidney disease, infections, low magnesium.  Will aim to use at the lowest dose for the shortest period of time.  Mag level today.      Relevant Medications   omeprazole (PRILOSEC) 40 MG capsule   Other Relevant Orders   Magnesium     Endocrine   Insulin resistance    Noted on past labs with [facility] -- check A1c today.      Relevant Orders   HgB A1c     Other   Class 3 severe obesity with serious comorbidity and body mass index (BMI) of 50.0 to 59.9 in adult [facility])    Is working with [employer], continue this collaboration.  Recent notes and labs reviewed.  Recent gastrectomy on 03/25/22.      Generalized anxiety disorder    Refer to depression plan of care.      Relevant Medications   busPIRone (BUSPAR) 15 MG tablet   Hyperlipidemia    History of on labs, recent gastric sleeve ay [facility] and is losing weight.  Lipid panel today.      Relevant Orders   Comprehensive metabolic panel   Lipid Panel w/o Chol/HDL Ratio   Iron deficiency anemia    Stable on labs post op.  Check labs today and continue supplement as needed based on results.      Relevant Orders   CBC with Differential/Platelet   TSH   Iron Binding Cap (TIBC)([facility])   Ferritin   Mild episode of recurrent major depressive disorder (HCC) - Primary    Chronic, ongoing.  Denies SI/HI.  Continue Wellbutrin 300 MG XL daily and Effexor XR 75 MG BID, change Buspar to as needed only.  Discussed at length with patient.        Relevant Medications   busPIRone (BUSPAR) 15 MG tablet   S/P laparoscopic  sleeve gastrectomy    Continue to collaborate with [facility].      Vitamin D deficiency    Chronic, stable.  Recommend continue supplement and recheck level today.  Adjust regimen as needed.      Relevant Orders   VITAMIN D 25 Hydroxy (Vit-D Deficiency, Fractures)   Other Visit Diagnoses     Encounter for annual physical exam       Annual physical today with labs and health maintenance reviewed, discussed with patient.        Follow up plan: Return in about 6 months (around 12/08/2022) for [place].   LABORATORY TESTING:  - Pap smear: up to date  IMMUNIZATIONS:   - Tdap: Tetanus vaccination status reviewed: last tetanus booster within 10 years. - Influenza: Refused -- will obtain at later date - Pneumovax: Not applicable - Prevnar: Not applicable - COVID: Refused - HPV: Not applicable - Shingrix vaccine: Not applicable  SCREENING: -Mammogram: Not applicable  - Colonoscopy: Not applicable  - Bone Density: Not applicable  -Hearing Test: Not applicable  -Spirometry: Not applicable   PATIENT COUNSELING:   Advised to take 1 mg of folate supplement per day if capable of pregnancy.   Sexuality: Discussed sexually transmitted diseases, partner selection, use of condoms, avoidance of unintended pregnancy  and contraceptive alternatives.   Advised to avoid cigarette smoking.  I discussed with the patient that most people either abstain from alcohol or drink within safe limits (<=  14/week and <=4 drinks/occasion for males, <=7/weeks and <= 3 drinks/occasion for females) and that the risk for alcohol disorders and other health effects rises proportionally with the number of drinks per week and how often a drinker exceeds daily limits.  Discussed cessation/primary prevention of drug use and availability of treatment for abuse.   Diet: Encouraged to adjust caloric intake to maintain  or achieve ideal body weight, to reduce intake of dietary saturated fat  and total fat, to limit sodium intake by avoiding high sodium foods and not adding table salt, and to maintain adequate dietary potassium and calcium preferably from fresh fruits, vegetables, and low-fat dairy products.    Stressed the importance of regular exercise  Injury prevention: Discussed safety belts, safety helmets, smoke detector, smoking near bedding or upholstery.   Dental health: Discussed importance of regular tooth brushing, flossing, and dental visits.    NEXT PREVENTATIVE PHYSICAL DUE IN 1 YEAR. Return in about 6 months (around 12/08/2022) for [place].

## 2022-06-09 NOTE — Assessment & Plan Note (Signed)
Refer to depression plan of care. 

## 2022-06-09 NOTE — Assessment & Plan Note (Signed)
History of on labs, recent gastric sleeve ay [facility] and is losing weight.  Lipid panel today.

## 2022-06-09 NOTE — Assessment & Plan Note (Signed)
Stable on labs post op.  Check labs today and continue supplement as needed based on results.

## 2022-06-10 LAB — COMPREHENSIVE METABOLIC PANEL
ALT: 14 IU/L (ref 0–32)
AST: 14 IU/L (ref 0–40)
Albumin/Globulin Ratio: 1.8 (ref 1.2–2.2)
Albumin: 4.4 g/dL (ref 3.9–4.9)
Alkaline Phosphatase: 82 IU/L (ref 44–121)
BUN/Creatinine Ratio: 32 — ABNORMAL HIGH (ref 9–23)
BUN: 25 mg/dL — ABNORMAL HIGH (ref 6–20)
Bilirubin Total: 0.3 mg/dL (ref 0.0–1.2)
CO2: 21 mmol/L (ref 20–29)
Calcium: 9.3 mg/dL (ref 8.7–10.2)
Chloride: 102 mmol/L (ref 96–106)
Creatinine, Ser: 0.79 mg/dL (ref 0.57–1.00)
Globulin, Total: 2.5 g/dL (ref 1.5–4.5)
Glucose: 92 mg/dL (ref 70–99)
Potassium: 4.1 mmol/L (ref 3.5–5.2)
Sodium: 138 mmol/L (ref 134–144)
Total Protein: 6.9 g/dL (ref 6.0–8.5)
eGFR: 98 mL/min/{1.73_m2} (ref >59)

## 2022-06-10 LAB — IRON AND TIBC
Iron Saturation: 13 % — ABNORMAL LOW (ref 15–55)
Iron: 45 ug/dL (ref 27–159)
Total Iron Binding Capacity: 350 ug/dL (ref 250–450)
UIBC: 305 ug/dL (ref 131–425)

## 2022-06-10 LAB — CBC WITH DIFFERENTIAL/PLATELET
Basophils Absolute: 0.1 10*3/uL (ref 0.0–0.2)
Basos: 1 %
EOS (ABSOLUTE): 0.2 10*3/uL (ref 0.0–0.4)
Eos: 2 %
Hematocrit: 36.4 % (ref 34.0–46.6)
Hemoglobin: 11.4 g/dL (ref 11.1–15.9)
Immature Grans (Abs): 0 10*3/uL (ref 0.0–0.1)
Immature Granulocytes: 0 %
Lymphocytes Absolute: 2 10*3/uL (ref 0.7–3.1)
Lymphs: 24 %
MCH: 25.7 pg — ABNORMAL LOW (ref 26.6–33.0)
MCHC: 31.3 g/dL — ABNORMAL LOW (ref 31.5–35.7)
MCV: 82 fL (ref 79–97)
Monocytes Absolute: 0.5 10*3/uL (ref 0.1–0.9)
Monocytes: 6 %
Neutrophils Absolute: 5.6 10*3/uL (ref 1.4–7.0)
Neutrophils: 67 %
Platelets: 290 10*3/uL (ref 150–450)
RBC: 4.44 x10E6/uL (ref 3.77–5.28)
RDW: 15.6 % — ABNORMAL HIGH (ref 11.7–15.4)
WBC: 8.3 10*3/uL (ref 3.4–10.8)

## 2022-06-10 LAB — LIPID PANEL W/O CHOL/HDL RATIO
Cholesterol, Total: 227 mg/dL — ABNORMAL HIGH (ref 100–199)
HDL: 49 mg/dL (ref >39)
LDL Chol Calc (NIH): 142 mg/dL — ABNORMAL HIGH (ref 0–99)
Triglycerides: 202 mg/dL — ABNORMAL HIGH (ref 0–149)
VLDL Cholesterol Cal: 36 mg/dL (ref 5–40)

## 2022-06-10 LAB — FERRITIN: Ferritin: 16 ng/mL (ref 15–150)

## 2022-06-10 LAB — HEMOGLOBIN A1C
Est. average glucose Bld gHb Est-mCnc: 117 mg/dL
Hgb A1c MFr Bld: 5.7 % — ABNORMAL HIGH (ref 4.8–5.6)

## 2022-06-10 LAB — TSH: TSH: 1.75 u[IU]/mL (ref 0.450–4.500)

## 2022-06-10 LAB — MAGNESIUM: Magnesium: 2.2 mg/dL (ref 1.6–2.3)

## 2022-06-10 LAB — VITAMIN D 25 HYDROXY (VIT D DEFICIENCY, FRACTURES): Vit D, 25-Hydroxy: 110 ng/mL — ABNORMAL HIGH (ref 30.0–100.0)

## 2022-06-10 NOTE — Progress Notes (Signed)
Contacted via [place] morning [place], your labs have returned: - Kidney function, creatinine and eGFR, remains normal, as is liver function, AST and ALT.   - Iron level on low side of normal, recommend a multivitamin daily to maintain this.   - Vitamin d level above goal, stop weekly supplement and change to Vitamin D3 2000 units every other day over the counter supplement.  Any questions on that? The A1C is the diabetes testing we talked about, this looks at your blood sugars over the past 3 months and turns the average into a number.  Your number is 5.7%, meaning you are prediabetic.  Any number 5.7 to 6.4 is considered prediabetes and any number 6.5 or greater is considered diabetes.   I would recommend heavy focus on decreasing foods high in sugar and your intake of things like bread products, pasta, and rice.  The American Diabetes Association online has a large amount of information on diet changes to make.  We will recheck this number in 3 months to ensure you are not continuing to trend upwards and move into diabetes.  Have a good day. - Your cholesterol is still high, but continued recommendations to make lifestyle changes. Your LDL is above normal. The LDL is the bad cholesterol. Over time and in combination with inflammation and other factors, this contributes to plaque which in turn may lead to stroke and/or heart attack down the road. Sometimes high LDL is primarily genetic, and people might be eating all the right foods but still have high numbers. Other times, there is room for improvement in one's diet and eating healthier can bring this number down and potentially reduce one's risk of heart attack and/or stroke.   To reduce your LDL, Remember - more fruits and vegetables, more fish, and limit red meat and dairy products. More soy, nuts, beans, barley, lentils, oats and plant sterol ester enriched margarine instead of butter. I also encourage eliminating sugar and processed  food. Remember, shop on the outside of the grocery store and visit your [employer]. If you would like to talk with me about dietary changes for your cholesterol, please let me know. We should recheck your cholesterol in 12 months.  Any questions? Keep being amazing!!  Thank you for allowing me to participate in your care.  I appreciate you. Kindest regards, Stephen Turnbaugh

## 2022-06-25 DIAGNOSIS — G4733 Obstructive sleep apnea (adult) (pediatric): Secondary | ICD-10-CM | POA: Diagnosis not present

## 2022-07-10 ENCOUNTER — Encounter: Payer: Self-pay | Admitting: Nurse Practitioner

## 2022-07-10 DIAGNOSIS — Z9884 Bariatric surgery status: Secondary | ICD-10-CM | POA: Diagnosis not present

## 2022-07-10 DIAGNOSIS — Z6841 Body Mass Index (BMI) 40.0 and over, adult: Secondary | ICD-10-CM | POA: Diagnosis not present

## 2022-07-10 DIAGNOSIS — R634 Abnormal weight loss: Secondary | ICD-10-CM | POA: Diagnosis not present

## 2022-07-10 DIAGNOSIS — R1013 Epigastric pain: Secondary | ICD-10-CM | POA: Diagnosis not present

## 2022-07-18 DIAGNOSIS — B351 Tinea unguium: Secondary | ICD-10-CM | POA: Insufficient documentation

## 2022-07-18 NOTE — Patient Instructions (Signed)

## 2022-07-20 ENCOUNTER — Encounter: Payer: Self-pay | Admitting: Nurse Practitioner

## 2022-07-20 ENCOUNTER — Ambulatory Visit: Payer: Medicaid Other | Admitting: Nurse Practitioner

## 2022-07-20 VITALS — BP 128/82 | HR 75 | Temp 99.3°F | Ht 69.0 in | Wt 308.2 lb

## 2022-07-20 DIAGNOSIS — Z6841 Body Mass Index (BMI) 40.0 and over, adult: Secondary | ICD-10-CM

## 2022-07-20 DIAGNOSIS — Z23 Encounter for immunization: Secondary | ICD-10-CM

## 2022-07-20 DIAGNOSIS — B351 Tinea unguium: Secondary | ICD-10-CM | POA: Diagnosis not present

## 2022-07-20 MED ORDER — TERBINAFINE HCL 250 MG PO TABS: 250.0000 mg | ORAL_TABLET | Freq: Every day | ORAL | 2 refills | Status: AC

## 2022-07-20 NOTE — Assessment & Plan Note (Signed)
BMI 45.51, continues to lose.  Is working with [employer], continue this collaboration.  Recent notes and labs reviewed.  Recent gastrectomy on 03/25/22.

## 2022-07-20 NOTE — Assessment & Plan Note (Signed)
Ongoing, no response to [place].  Recent LFTs stable and CBC.  Will trial Terbinafine with her, instructed her on use and side effects to monitor for.  Plan on 90 days only use.  Will check CBC and CMP monthly.

## 2022-07-20 NOTE — Progress Notes (Signed)
BP 128/82   Pulse 75   Temp 99.3 F (37.4 C) (Oral)   Ht 5' 9" (1.753 m)   Wt (!) 308 lb 3.2 oz (139.8 kg)   SpO2 96%   BMI 45.51 kg/m    Subjective:    Patient ID: Rachel Cooley, female    DOB: [birth year], 41 y.o.   MRN: [id number]  HPI: Rachel Cooley is a 41 y.o. female  Chief Complaint  Patient presents with   Nail Problem    Patient is here for Toenail Problem. Patient says she is not sure if she has gotten a fungus from the nail salon after getting pedicure. Patient says she first noticed it a while ago and received a prescription for topical ointment. Patient says she has noticed it is worse.    ONYCHOMYCOSIS: At last visit we started Penlac and this did not help.  Has fungal disease to both feet.  Does get pedicures every two weeks.  Feels that it is getting worse with fungal disease.  Does keep toenail polish on them at baseline.  It is not on all toes, more to great toes and second toe right foot.  Denies pain.  They are brittle and will break easily.  Would like to try oral medication for treatment -- AST/ALT 15/13, on labs 07/10/22, no underlying liver disease.  Relevant past medical, surgical, family and social history reviewed and updated as indicated. Interim medical history since our last visit reviewed. Allergies and medications reviewed and updated.  Review of Systems  Constitutional:  Negative for activity change, appetite change, diaphoresis, fatigue and fever.  Respiratory:  Negative for cough, chest tightness and shortness of breath.   Cardiovascular:  Negative for chest pain, palpitations and leg swelling.  Gastrointestinal: Negative.   Neurological: Negative.   Psychiatric/Behavioral:  Negative for decreased concentration, self-injury, sleep disturbance and suicidal ideas. The patient is not nervous/anxious.     Per HPI unless specifically indicated above     Objective:    BP 128/82   Pulse 75   Temp 99.3 F (37.4 C) (Oral)   Ht 5' 9"  (1.753 m)   Wt (!) 308 lb 3.2 oz (139.8 kg)   SpO2 96%   BMI 45.51 kg/m   Wt Readings from Last 3 Encounters:  07/20/22 (!) 308 lb 3.2 oz (139.8 kg)  06/09/22 (!) 323 lb 4.8 oz (146.6 kg)  04/20/22 (!) 342 lb 3.2 oz (155.2 kg)    Physical Exam Vitals and nursing note reviewed.  Constitutional:      General: She is awake. She is not in acute distress.    Appearance: She is well-developed and well-groomed. She is morbidly obese. She is not ill-appearing or toxic-appearing.  HENT:     Head: Normocephalic.     Right Ear: Hearing normal.     Left Ear: Hearing normal.  Eyes:     General: Lids are normal.        Right eye: No discharge.        Left eye: No discharge.     Conjunctiva/sclera: Conjunctivae normal.     Pupils: Pupils are equal, round, and reactive to light.  Neck:     Thyroid: No thyromegaly.     Vascular: No carotid bruit.  Cardiovascular:     Rate and Rhythm: Normal rate and regular rhythm.     Pulses:          Dorsalis pedis pulses are 2+ on the right side and 2+ on  the left side.       Posterior tibial pulses are 2+ on the right side and 2+ on the left side.     Heart sounds: Normal heart sounds. No murmur heard.    No gallop.  Pulmonary:     Effort: Pulmonary effort is normal. No accessory muscle usage or respiratory distress.     Breath sounds: Normal breath sounds.  Abdominal:     General: Bowel sounds are normal.     Palpations: Abdomen is soft.  Musculoskeletal:     Cervical back: Normal range of motion and neck supple.     Right lower leg: No edema.     Left lower leg: No edema.     Right foot: Normal range of motion.     Left foot: Normal range of motion.  Feet:     Right foot:     Protective Sensation: 10 sites tested.  10 sites sensed.     Skin integrity: Skin integrity normal.     Toenail Condition: Fungal disease present.    Left foot:     Protective Sensation: 10 sites tested.  10 sites sensed.     Skin integrity: Skin integrity normal.      Toenail Condition: Fungal disease present.    Comments: Fungal disease to great toes bilaterally and to second toe right foot. Skin:    General: Skin is warm and dry.  Neurological:     Mental Status: She is alert and oriented to person, place, and time.  Psychiatric:        Attention and Perception: Attention normal.        Mood and Affect: Mood normal.        Speech: Speech normal.        Behavior: Behavior normal. Behavior is cooperative.        Thought Content: Thought content normal.     Results for orders placed or performed in visit on 06/09/22  CBC with Differential/Platelet  Result Value Ref Range   WBC 8.3 3.4 - 10.8 x10E3/uL   RBC 4.44 3.77 - 5.28 x10E6/uL   Hemoglobin 11.4 11.1 - 15.9 g/dL   Hematocrit 36.4 34.0 - 46.6 %   MCV 82 79 - 97 fL   MCH 25.7 (L) 26.6 - 33.0 pg   MCHC 31.3 (L) 31.5 - 35.7 g/dL   RDW 15.6 (H) 11.7 - 15.4 %   Platelets 290 150 - 450 x10E3/uL   Neutrophils 67 Not Estab. %   Lymphs 24 Not Estab. %   Monocytes 6 Not Estab. %   Eos 2 Not Estab. %   Basos 1 Not Estab. %   Neutrophils Absolute 5.6 1.4 - 7.0 x10E3/uL   Lymphocytes Absolute 2.0 0.7 - 3.1 x10E3/uL   Monocytes Absolute 0.5 0.1 - 0.9 x10E3/uL   EOS (ABSOLUTE) 0.2 0.0 - 0.4 x10E3/uL   Basophils Absolute 0.1 0.0 - 0.2 x10E3/uL   Immature Granulocytes 0 Not Estab. %   Immature Grans (Abs) 0.0 0.0 - 0.1 x10E3/uL  Comprehensive metabolic panel  Result Value Ref Range   Glucose 92 70 - 99 mg/dL   BUN 25 (H) 6 - 20 mg/dL   Creatinine, Ser 0.79 0.57 - 1.00 mg/dL   eGFR 98 >59 mL/min/1.73   BUN/Creatinine Ratio 32 (H) 9 - 23   Sodium 138 134 - 144 mmol/L   Potassium 4.1 3.5 - 5.2 mmol/L   Chloride 102 96 - 106 mmol/L   CO2 21 20 - 29 mmol/L  Calcium 9.3 8.7 - 10.2 mg/dL   Total Protein 6.9 6.0 - 8.5 g/dL   Albumin 4.4 3.9 - 4.9 g/dL   Globulin, Total 2.5 1.5 - 4.5 g/dL   Albumin/Globulin Ratio 1.8 1.2 - 2.2   Bilirubin Total 0.3 0.0 - 1.2 mg/dL   Alkaline Phosphatase 82 44 -  121 IU/L   AST 14 0 - 40 IU/L   ALT 14 0 - 32 IU/L  Lipid Panel w/o Chol/HDL Ratio  Result Value Ref Range   Cholesterol, Total 227 (H) 100 - 199 mg/dL   Triglycerides 202 (H) 0 - 149 mg/dL   HDL 49 >39 mg/dL   VLDL Cholesterol Cal 36 5 - 40 mg/dL   LDL Chol Calc (NIH) 142 (H) 0 - 99 mg/dL  TSH  Result Value Ref Range   TSH 1.750 0.450 - 4.500 uIU/mL  Iron Binding Cap (TIBC)([facility])  Result Value Ref Range   Total Iron Binding Capacity 350 250 - 450 ug/dL   UIBC 305 131 - 425 ug/dL   Iron 45 27 - 159 ug/dL   Iron Saturation 13 (L) 15 - 55 %  Ferritin  Result Value Ref Range   Ferritin 16 15 - 150 ng/mL  VITAMIN D 25 Hydroxy (Vit-D Deficiency, Fractures)  Result Value Ref Range   Vit D, 25-Hydroxy 110.0 (H) 30.0 - 100.0 ng/mL  HgB A1c  Result Value Ref Range   Hgb A1c MFr Bld 5.7 (H) 4.8 - 5.6 %   Est. average glucose Bld gHb Est-mCnc 117 mg/dL  Magnesium  Result Value Ref Range   Magnesium 2.2 1.6 - 2.3 mg/dL      Assessment & Plan:   Problem List Items Addressed This Visit       Musculoskeletal and Integument   Onychomycosis    Ongoing, no response to Penlac.  Recent LFTs stable and CBC.  Will trial Terbinafine with her, instructed her on use and side effects to monitor for.  Plan on 90 days only use.  Will check CBC and CMP monthly.        Relevant Medications   valACYclovir (VALTREX) 1000 MG tablet   terbinafine (LAMISIL) 250 MG tablet   Other Relevant Orders   Comp Met (CMET)   CBC with Differential/Platelet     Other   Class 3 severe obesity with serious comorbidity and body mass index (BMI) of 50.0 to 59.9 in adult ([place]) - Primary    BMI 45.51, continues to lose.  Is working with [employer], continue this collaboration.  Recent notes and labs reviewed.  Recent gastrectomy on 03/25/22.      Other Visit Diagnoses     Flu vaccine need       Flu vaccine in office today.        Follow up plan: Return in about 3 months (around  10/20/2022) for Onychomycosis -- needs lab visit only in one month.

## 2022-07-25 DIAGNOSIS — G4733 Obstructive sleep apnea (adult) (pediatric): Secondary | ICD-10-CM | POA: Diagnosis not present

## 2022-07-26 DIAGNOSIS — M25561 Pain in right knee: Secondary | ICD-10-CM | POA: Diagnosis not present

## 2022-08-24 ENCOUNTER — Other Ambulatory Visit: Payer: Medicaid Other

## 2022-08-24 DIAGNOSIS — B351 Tinea unguium: Secondary | ICD-10-CM | POA: Diagnosis not present

## 2022-08-25 LAB — CBC WITH DIFFERENTIAL/PLATELET
Basophils Absolute: 0.1 10*3/uL (ref 0.0–0.2)
Basos: 1 %
EOS (ABSOLUTE): 0.1 10*3/uL (ref 0.0–0.4)
Eos: 2 %
Hematocrit: 37.9 % (ref 34.0–46.6)
Hemoglobin: 12.1 g/dL (ref 11.1–15.9)
Immature Grans (Abs): 0 10*3/uL (ref 0.0–0.1)
Immature Granulocytes: 0 %
Lymphocytes Absolute: 1.4 10*3/uL (ref 0.7–3.1)
Lymphs: 21 %
MCH: 27.6 pg (ref 26.6–33.0)
MCHC: 31.9 g/dL (ref 31.5–35.7)
MCV: 86 fL (ref 79–97)
Monocytes Absolute: 0.4 10*3/uL (ref 0.1–0.9)
Monocytes: 5 %
Neutrophils Absolute: 4.7 10*3/uL (ref 1.4–7.0)
Neutrophils: 71 %
Platelets: 273 10*3/uL (ref 150–450)
RBC: 4.39 x10E6/uL (ref 3.77–5.28)
RDW: 16.5 % — ABNORMAL HIGH (ref 11.7–15.4)
WBC: 6.7 10*3/uL (ref 3.4–10.8)

## 2022-08-25 LAB — COMPREHENSIVE METABOLIC PANEL
ALT: 11 IU/L (ref 0–32)
AST: 12 IU/L (ref 0–40)
Albumin/Globulin Ratio: 2 (ref 1.2–2.2)
Albumin: 4.3 g/dL (ref 3.9–4.9)
Alkaline Phosphatase: 80 IU/L (ref 44–121)
BUN/Creatinine Ratio: 17 (ref 9–23)
BUN: 13 mg/dL (ref 6–20)
Bilirubin Total: 0.2 mg/dL (ref 0.0–1.2)
CO2: 23 mmol/L (ref 20–29)
Calcium: 9.7 mg/dL (ref 8.7–10.2)
Chloride: 105 mmol/L (ref 96–106)
Creatinine, Ser: 0.77 mg/dL (ref 0.57–1.00)
Globulin, Total: 2.2 g/dL (ref 1.5–4.5)
Glucose: 67 mg/dL — ABNORMAL LOW (ref 70–99)
Potassium: 4.3 mmol/L (ref 3.5–5.2)
Sodium: 140 mmol/L (ref 134–144)
Total Protein: 6.5 g/dL (ref 6.0–8.5)
eGFR: 101 mL/min/{1.73_m2} (ref >59)

## 2022-08-25 NOTE — Progress Notes (Signed)
Contacted via MyChart   Good afternoon Rachel Cooley, your labs have returned and are overall improved and stable this check.  No changes needed!!  [employer]!! Keep being stellar!!  Thank you for allowing me to participate in your care.  I appreciate you. Kindest regards, Torina Ey

## 2022-09-25 DIAGNOSIS — Z6841 Body Mass Index (BMI) 40.0 and over, adult: Secondary | ICD-10-CM | POA: Diagnosis not present

## 2022-09-25 DIAGNOSIS — Z713 Dietary counseling and surveillance: Secondary | ICD-10-CM | POA: Diagnosis not present

## 2022-10-14 ENCOUNTER — Telehealth: Payer: Medicaid Other | Admitting: Family

## 2022-10-14 DIAGNOSIS — R6889 Other general symptoms and signs: Secondary | ICD-10-CM

## 2022-10-14 NOTE — Progress Notes (Signed)
Virtual Visit Consent   NALIYA Cooley, you are scheduled for a virtual visit with a [facility] provider today. Just as with appointments in the office, your consent must be obtained to participate. Your consent will be active for this visit and any virtual visit you may have with one of our providers in the next 365 days. If you have a MyChart account, a copy of this consent can be sent to you electronically.  As this is a virtual visit, video technology does not allow for your provider to perform a traditional examination. This may limit your provider's ability to fully assess your condition. If your provider identifies any concerns that need to be evaluated in person or the need to arrange testing (such as labs, EKG, etc.), we will make arrangements to do so. Although advances in technology are sophisticated, we cannot ensure that it will always work on either your end or our end. If the connection with a video visit is poor, the visit may have to be switched to a telephone visit. With either a video or telephone visit, we are not always able to ensure that we have a secure connection.  By engaging in this virtual visit, you consent to the provision of healthcare and authorize for your insurance to be billed (if applicable) for the services provided during this visit. Depending on your insurance coverage, you may receive a charge related to this service.  I need to obtain your verbal consent now. Are you willing to proceed with your visit today? Rachel Cooley has provided verbal consent on 10/14/2022 for a virtual visit (video or telephone). Evelina Dun, FNP  Date: 10/14/2022 11:10 AM  Virtual Visit via Video Note   I, Evelina Dun, connected with  Rachel Cooley  ([id number], 07-07-1981) on 10/14/22 at 11:15 AM EST by a video-enabled telemedicine application and verified that I am speaking with the correct person using two identifiers.  Location: Patient: Virtual Visit Location  Patient: Other: work Provider: Ecologist: Home Office   I discussed the limitations of evaluation and management by telemedicine and the availability of in person appointments. The patient expressed understanding and agreed to proceed.    History of Present Illness: Rachel Cooley is a 42 y.o. who identifies as a female who was assigned female at birth, and is being seen today for cough and body aches . Daughter tested positive for flu over the weekend.   HPI: Cough This is a new problem. The current episode started yesterday. The problem has been gradually worsening. The problem occurs every few minutes. The cough is Non-productive. Associated symptoms include chills, headaches, myalgias, nasal congestion and a sore throat. Pertinent negatives include no ear congestion, ear pain, fever, rhinorrhea, shortness of breath or wheezing. She has tried rest for the symptoms. The treatment provided mild relief.    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   Osteoarthritis of knee 02/16/2022   OSA on CPAP 12/01/2021   Class 3 severe obesity with serious comorbidity and body mass index (BMI) of 50.0 to 59.9 in adult [facility]) 04/24/2021   Hyperlipidemia 03/17/2021   Insulin resistance 04/03/2020   Vitamin D deficiency 04/03/2020   Mild episode of recurrent major depressive disorder ([place]) 04/28/2019   Generalized anxiety disorder 04/28/2019   GERD (gastroesophageal reflux disease) 04/28/2019   Spondylosis of lumbar region without myelopathy  or radiculopathy 03/12/2015    Allergies: No Known Allergies Medications:  Current Outpatient Medications:    buPROPion (WELLBUTRIN XL) 300 MG 24 hr tablet, Take 1 tablet (300 mg total) by mouth daily., Disp: 90 tablet, Rfl: 4   busPIRone (BUSPAR) 15 MG tablet, Take 1 tablet (15 mg total) by mouth 2 (two)  times daily as needed., Disp: 180 tablet, Rfl: 4   cetirizine (ZYRTEC) 10 MG tablet, Take by mouth., Disp: , Rfl:    Cholecalciferol 1.25 MG (50000 UT) capsule, cholecalciferol (vitamin D3) 1,250 mcg (50,000 unit) capsule  TAKE 1 TABLET BY MOUTH ONE TIME PER WEEK, Disp: , Rfl:    docusate sodium (COLACE) 100 MG capsule, Take 100 mg by mouth at bedtime., Disp: , Rfl:    omeprazole (PRILOSEC) 40 MG capsule, Take 1 capsule (40 mg total) by mouth daily., Disp: 90 capsule, Rfl: 4   paragard intrauterine copper IUD IUD, by Intrauterine route., Disp: , Rfl:    terbinafine (LAMISIL) 250 MG tablet, Take 1 tablet (250 mg total) by mouth daily., Disp: 30 tablet, Rfl: 2   ursodiol (ACTIGALL) 300 MG capsule, Take 300 mg by mouth 2 (two) times daily., Disp: , Rfl:    valACYclovir (VALTREX) 1000 MG tablet, Take 1,000 mg by mouth 2 (two) times daily., Disp: , Rfl:    venlafaxine XR (EFFEXOR-XR) 75 MG 24 hr capsule, TAKE 1 CAPSULE BY MOUTH TWICE A DAY, Disp: 180 capsule, Rfl: 4  Observations/Objective: Patient is well-developed, well-nourished in no acute distress.  Resting comfortably  Head is normocephalic, atraumatic.  No labored breathing.  Speech is clear and coherent with logical content.  Patient is alert and oriented at baseline.    Assessment and Plan: 1. Flu-like symptoms  Rest Tylenol  Droplet precautions  Work note given Follow up if symptoms worsen or do not improve   Follow Up Instructions: I discussed the assessment and treatment plan with the patient. The patient was provided an opportunity to ask questions and all were answered. The patient agreed with the plan and demonstrated an understanding of the instructions.  A copy of instructions were sent to the patient via MyChart unless otherwise noted below.    The patient was advised to call back or seek an in-person evaluation if the symptoms worsen or if the condition fails to improve as anticipated.  Time:  I spent 8 minutes with  the patient via telehealth technology discussing the above problems/concerns.    Evelina Dun, FNP

## 2022-10-14 NOTE — Patient Instructions (Signed)

## 2022-10-15 MED ORDER — OSELTAMIVIR PHOSPHATE 75 MG PO CAPS: 75.0000 mg | ORAL_CAPSULE | Freq: Two times a day (BID) | ORAL | 0 refills | Status: DC

## 2022-10-20 ENCOUNTER — Ambulatory Visit: Payer: Medicaid Other | Admitting: Nurse Practitioner

## 2022-10-26 DIAGNOSIS — K219 Gastro-esophageal reflux disease without esophagitis: Secondary | ICD-10-CM | POA: Diagnosis not present

## 2022-10-26 DIAGNOSIS — Z9884 Bariatric surgery status: Secondary | ICD-10-CM | POA: Diagnosis not present

## 2022-10-26 DIAGNOSIS — F411 Generalized anxiety disorder: Secondary | ICD-10-CM | POA: Diagnosis not present

## 2022-10-26 DIAGNOSIS — Z6841 Body Mass Index (BMI) 40.0 and over, adult: Secondary | ICD-10-CM | POA: Diagnosis not present

## 2022-10-26 DIAGNOSIS — R634 Abnormal weight loss: Secondary | ICD-10-CM | POA: Diagnosis not present

## 2022-11-11 ENCOUNTER — Other Ambulatory Visit: Payer: Self-pay | Admitting: Nurse Practitioner

## 2022-11-11 NOTE — Telephone Encounter (Signed)
Appointment 01/01/23- will extend- last addressed at 9/23 appointment Requested Prescriptions  Pending Prescriptions Disp Refills   venlafaxine XR (EFFEXOR-XR) 75 MG 24 hr capsule [Pharmacy Med Name: VENLAFAXINE HCL ER 75 MG CAP] 180 capsule 0    Sig: TAKE 1 CAPSULE BY MOUTH TWICE A DAY     Psychiatry: Antidepressants - SNRI - desvenlafaxine & venlafaxine Failed - 11/11/2022  4:47 AM      Failed - Lipid Panel in normal range within the last 12 months    Cholesterol, Total  Date Value Ref Range Status  06/09/2022 227 (H) 100 - 199 mg/dL Final   LDL Chol Calc (NIH)  Date Value Ref Range Status  06/09/2022 142 (H) 0 - 99 mg/dL Final   HDL  Date Value Ref Range Status  06/09/2022 49 >39 mg/dL Final   Triglycerides  Date Value Ref Range Status  06/09/2022 202 (H) 0 - 149 mg/dL Final         Passed - Cr in normal range and within 360 days    Creatinine, Ser  Date Value Ref Range Status  08/24/2022 0.77 0.57 - 1.00 mg/dL Final         Passed - Completed PHQ-2 or PHQ-9 in the last 360 days      Passed - Last BP in normal range    BP Readings from Last 1 Encounters:  07/20/22 128/82         Passed - Valid encounter within last 6 months    Recent Outpatient Visits           3 months ago Class 3 severe obesity due to excess calories with serious comorbidity and body mass index (BMI) of 50.0 to 59.9 in adult [facility])   [place], Washburn T, NP   5 months ago Mild episode of recurrent major depressive disorder ([place])   [place], Jolene T, NP   7 months ago Mild episode of recurrent major depressive disorder ([place])   [place], Henrine Screws T, NP   8 months ago Vaginal lesion   [place], Winnett T, NP   8 months ago Abnormal vaginal bleeding   [place], Barbaraann Faster, NP       Future Appointments             In 1  month [place], Barbaraann Faster, NP [place]

## 2022-11-24 ENCOUNTER — Ambulatory Visit: Payer: Medicaid Other | Admitting: Nurse Practitioner

## 2022-11-24 ENCOUNTER — Encounter: Payer: Self-pay | Admitting: Nurse Practitioner

## 2022-11-24 VITALS — BP 123/83 | HR 94 | Temp 97.8°F | Ht 69.02 in | Wt 283.2 lb

## 2022-11-24 DIAGNOSIS — R0981 Nasal congestion: Secondary | ICD-10-CM | POA: Insufficient documentation

## 2022-11-24 MED ORDER — PREDNISONE 20 MG PO TABS: 40.0000 mg | ORAL_TABLET | Freq: Every day | ORAL | 0 refills | Status: AC

## 2022-11-24 MED ORDER — LIDOCAINE VISCOUS HCL 2 % MT SOLN: 15.0000 mL | OROMUCOSAL | 0 refills | Status: DC | PRN

## 2022-11-24 NOTE — Progress Notes (Signed)
BP 123/83   Pulse 94   Temp 97.8 F (36.6 C) (Oral)   Ht 5' 9.02" (1.753 m)   Wt 283 lb 3.2 oz (128.5 kg)   SpO2 98%   BMI 41.80 kg/m    Subjective:    Patient ID: Rachel Cooley, female    DOB: 12-19-1980, 42 y.o.   MRN: [id number]  HPI: Rachel Cooley is a 42 y.o. female  Chief Complaint  Patient presents with   Sore Throat    Started on Friday   UPPER RESPIRATORY TRACT INFECTION Started with sore throat and congestion on Friday last week.  Recently treated for flu second week in January.  Has twins at home, younger.   Fever:  chills on Friday Cough: yes Shortness of breath:  occasional Wheezing: no Chest pain: no Chest tightness: no Chest congestion: no Nasal congestion: yes Runny nose: yes Post nasal drip: yes Sneezing: no Sore throat: yes Swollen glands: no Sinus pressure: no Headache: no Face pain: no Toothache: no Ear pain: yes bilateral Ear pressure: yes bilateral Eyes red/itching:no Eye drainage/crusting: no  Vomiting: no Rash: no Fatigue: yes Sick contacts:  has younger children at home Strep contacts: no  Context: stable Recurrent sinusitis: no Relief with OTC cold/cough medications: yes  Treatments attempted: Zinc and Sudafed PE, honey cough drops    Relevant past medical, surgical, family and social history reviewed and updated as indicated. Interim medical history since our last visit reviewed. Allergies and medications reviewed and updated.  Review of Systems  Constitutional:  Positive for chills and fatigue. Negative for activity change, appetite change and fever.  HENT:  Positive for congestion, ear pain, postnasal drip, rhinorrhea and sore throat. Negative for ear discharge, facial swelling, sinus pressure, sinus pain, sneezing and voice change.   Respiratory:  Positive for cough and shortness of breath. Negative for chest tightness and wheezing.   Cardiovascular:  Negative for chest pain, palpitations and leg swelling.   Gastrointestinal: Negative.   Endocrine: Negative.   Musculoskeletal:  Positive for myalgias.  Neurological:  Negative for dizziness, numbness and headaches.  Psychiatric/Behavioral: Negative.     Per HPI unless specifically indicated above     Objective:    BP 123/83   Pulse 94   Temp 97.8 F (36.6 C) (Oral)   Ht 5' 9.02" (1.753 m)   Wt 283 lb 3.2 oz (128.5 kg)   SpO2 98%   BMI 41.80 kg/m   Wt Readings from Last 3 Encounters:  11/24/22 283 lb 3.2 oz (128.5 kg)  07/20/22 (!) 308 lb 3.2 oz (139.8 kg)  06/09/22 (!) 323 lb 4.8 oz (146.6 kg)    Physical Exam Vitals and nursing note reviewed.  Constitutional:      General: She is awake. She is not in acute distress.    Appearance: She is well-developed and well-groomed. She is obese. She is not ill-appearing or toxic-appearing.  HENT:     Head: Normocephalic.     Right Ear: Hearing, ear canal and external ear normal. A middle ear effusion is present. Tympanic membrane is not injected or perforated.     Left Ear: Hearing, ear canal and external ear normal. A middle ear effusion is present. Tympanic membrane is not injected or perforated.     Nose: Rhinorrhea present. Rhinorrhea is clear.     Right Sinus: No maxillary sinus tenderness or frontal sinus tenderness.     Left Sinus: No maxillary sinus tenderness or frontal sinus tenderness.  Mouth/Throat:     Mouth: Mucous membranes are moist.     Pharynx: Posterior oropharyngeal erythema (mild cobblestone appearance) present. No pharyngeal swelling or oropharyngeal exudate.  Eyes:     General: Lids are normal.        Right eye: No discharge.        Left eye: No discharge.     Conjunctiva/sclera: Conjunctivae normal.     Pupils: Pupils are equal, round, and reactive to light.  Neck:     Thyroid: No thyromegaly.     Vascular: No carotid bruit.  Cardiovascular:     Rate and Rhythm: Normal rate and regular rhythm.     Heart sounds: Normal heart sounds. No murmur heard.     No gallop.  Pulmonary:     Effort: Pulmonary effort is normal. No accessory muscle usage or respiratory distress.     Breath sounds: No decreased breath sounds, wheezing or rhonchi.  Abdominal:     General: Bowel sounds are normal.     Palpations: Abdomen is soft. There is no hepatomegaly or splenomegaly.  Musculoskeletal:     Cervical back: Normal range of motion and neck supple.     Right lower leg: No edema.     Left lower leg: No edema.  Lymphadenopathy:     Head:     Right side of head: Submandibular adenopathy present. No submental, tonsillar, preauricular or posterior auricular adenopathy.     Left side of head: Submandibular adenopathy present. No submental, tonsillar, preauricular or posterior auricular adenopathy.     Cervical: No cervical adenopathy.  Skin:    General: Skin is warm and dry.  Neurological:     Mental Status: She is alert and oriented to person, place, and time.  Psychiatric:        Attention and Perception: Attention normal.        Mood and Affect: Mood normal.        Speech: Speech normal.        Behavior: Behavior normal. Behavior is cooperative.        Thought Content: Thought content normal.     Results for orders placed or performed in visit on 08/24/22  CBC with Differential/Platelet  Result Value Ref Range   WBC 6.7 3.4 - 10.8 x10E3/uL   RBC 4.39 3.77 - 5.28 x10E6/uL   Hemoglobin 12.1 11.1 - 15.9 g/dL   Hematocrit 37.9 34.0 - 46.6 %   MCV 86 79 - 97 fL   MCH 27.6 26.6 - 33.0 pg   MCHC 31.9 31.5 - 35.7 g/dL   RDW 16.5 (H) 11.7 - 15.4 %   Platelets 273 150 - 450 x10E3/uL   Neutrophils 71 Not Estab. %   Lymphs 21 Not Estab. %   Monocytes 5 Not Estab. %   Eos 2 Not Estab. %   Basos 1 Not Estab. %   Neutrophils Absolute 4.7 1.4 - 7.0 x10E3/uL   Lymphocytes Absolute 1.4 0.7 - 3.1 x10E3/uL   Monocytes Absolute 0.4 0.1 - 0.9 x10E3/uL   EOS (ABSOLUTE) 0.1 0.0 - 0.4 x10E3/uL   Basophils Absolute 0.1 0.0 - 0.2 x10E3/uL   Immature Granulocytes  0 Not Estab. %   Immature Grans (Abs) 0.0 0.0 - 0.1 x10E3/uL  Comp Met (CMET)  Result Value Ref Range   Glucose 67 (L) 70 - 99 mg/dL   BUN 13 6 - 20 mg/dL   Creatinine, Ser 0.77 0.57 - 1.00 mg/dL   eGFR 101 >59 mL/min/1.73   BUN/Creatinine  Ratio 17 9 - 23   Sodium 140 134 - 144 mmol/L   Potassium 4.3 3.5 - 5.2 mmol/L   Chloride 105 96 - 106 mmol/L   CO2 23 20 - 29 mmol/L   Calcium 9.7 8.7 - 10.2 mg/dL   Total Protein 6.5 6.0 - 8.5 g/dL   Albumin 4.3 3.9 - 4.9 g/dL   Globulin, Total 2.2 1.5 - 4.5 g/dL   Albumin/Globulin Ratio 2.0 1.2 - 2.2   Bilirubin Total 0.2 0.0 - 1.2 mg/dL   Alkaline Phosphatase 80 44 - 121 IU/L   AST 12 0 - 40 IU/L   ALT 11 0 - 32 IU/L      Assessment & Plan:   Problem List Items Addressed This Visit       Respiratory   Sinus congestion - Primary    Acute for 4 days.  At this time obtain Covid/RSV/Flu swab.  Due to length of illness will not treat with abx therapy at this time, as suspect more viral in nature. Will send in Prednisone 40 MG daily for 5 days, discussed with patient.  Viscous Lidocaine for symptom management.  Recommend: - Increased rest - Increasing Fluids - Acetaminophen / ibuprofen as needed for fever/pain.  - Salt water gargling, chloraseptic spray and throat lozenges - Mucinex.  - Humidifying the air.  If no improvement in 4 days (placing 8 days of symptoms) or ongoing then to alert provider and abx therapy can be sent in.  To return to office if ongoing or worsening symptoms present.      Relevant Orders   COVID-19, Flu A+B and RSV     Follow up plan: Return if symptoms worsen or fail to improve.

## 2022-11-24 NOTE — Assessment & Plan Note (Signed)
Acute for 4 days.  At this time obtain Covid/RSV/Flu swab.  Due to length of illness will not treat with abx therapy at this time, as suspect more viral in nature. Will send in Prednisone 40 MG daily for 5 days, discussed with patient.  Viscous Lidocaine for symptom management.  Recommend: - Increased rest - Increasing Fluids - Acetaminophen / ibuprofen as needed for fever/pain.  - Salt water gargling, chloraseptic spray and throat lozenges - Mucinex.  - Humidifying the air.  If no improvement in 4 days (placing 8 days of symptoms) or ongoing then to alert provider and abx therapy can be sent in.  To return to office if ongoing or worsening symptoms present.

## 2022-11-24 NOTE — Patient Instructions (Signed)

## 2022-11-25 LAB — COVID-19, FLU A+B AND RSV
Influenza A, NAA: NOT DETECTED
Influenza B, NAA: DETECTED — AB
RSV, NAA: NOT DETECTED
SARS-CoV-2, NAA: NOT DETECTED

## 2022-11-25 NOTE — Progress Notes (Signed)
Contacted via MyChart   Good evening Rachel Cooley, your swab returned and does show influenza B present (flu).  It is past the 48 hour mark when we would start Tamiflu, since you started symptoms Friday.  At this time continue current treatment I sent in yesterday and get plenty of rest and fluids.  Watch the rest of the family for symptoms as flu does like to spread.  Any questions?

## 2022-12-13 ENCOUNTER — Other Ambulatory Visit: Payer: Self-pay | Admitting: Nurse Practitioner

## 2022-12-14 NOTE — Telephone Encounter (Signed)
Unable to refill per protocol, Rx request is too soon. Last refill 11/07/21 for 90 and 4 refills.  Requested Prescriptions  Pending Prescriptions Disp Refills   buPROPion (WELLBUTRIN XL) 300 MG 24 hr tablet [Pharmacy Med Name: BUPROPION HCL XL 300 MG TABLET] 90 tablet 4    Sig: TAKE 1 TABLET BY MOUTH EVERY DAY     Psychiatry: Antidepressants - bupropion Passed - 12/13/2022  8:12 AM      Passed - Cr in normal range and within 360 days    Creatinine, Ser  Date Value Ref Range Status  08/24/2022 0.77 0.57 - 1.00 mg/dL Final         Passed - AST in normal range and within 360 days    AST  Date Value Ref Range Status  08/24/2022 12 0 - 40 IU/L Final         Passed - ALT in normal range and within 360 days    ALT  Date Value Ref Range Status  08/24/2022 11 0 - 32 IU/L Final         Passed - Completed PHQ-2 or PHQ-9 in the last 360 days      Passed - Last BP in normal range    BP Readings from Last 1 Encounters:  11/24/22 123/83         Passed - Valid encounter within last 6 months    Recent Outpatient Visits           2 weeks ago Sinus congestion   [place], [place] T, NP   4 months ago Class 3 severe obesity due to excess calories with serious comorbidity and body mass index (BMI) of 50.0 to 59.9 in adult [facility])   [place], [place] T, NP   6 months ago Mild episode of recurrent major depressive disorder ([place])   [place], [place] T, NP   8 months ago Mild episode of recurrent major depressive disorder ([place])   [place] [facility] [employer] [facility], Henrine Screws T, NP   9 months ago Vaginal lesion   [place], Barbaraann Faster, NP       Future Appointments             In 2 weeks Cannady, Barbaraann Faster, NP [place], [facility]

## 2022-12-24 ENCOUNTER — Encounter: Payer: Self-pay | Admitting: Nurse Practitioner

## 2022-12-25 MED ORDER — BUPROPION HCL ER (XL) 300 MG PO TB24: 300.0000 mg | ORAL_TABLET | Freq: Every day | ORAL | 4 refills | Status: DC

## 2022-12-26 NOTE — Patient Instructions (Signed)
Managing Depression, Adult Depression is a mental health condition that affects your thoughts, feelings, and actions. Being diagnosed with depression can bring you relief if you did not know why you have felt or behaved a certain way. It could also leave you feeling overwhelmed. Finding ways to manage your symptoms can help you feel more positive about your future. How to manage lifestyle changes Being depressed is difficult. Depression can increase the level of everyday stress. Stress can make depression symptoms worse. You may believe your symptoms cannot be managed or will never improve. However, there are many things you can try to help manage your symptoms. There is hope. Managing stress  Stress is your body's reaction to life changes and events, both good and bad. Stress can add to your feelings of depression. Learning to manage your stress can help lessen your feelings of depression. Try some of the following approaches to reducing your stress (stress reduction techniques): Listen to music that you enjoy and that inspires you. Try using a meditation app or take a meditation class. Develop a practice that helps you connect with your spiritual self. Walk in nature, pray, or go to a place of worship. Practice deep breathing. To do this, inhale slowly through your nose. Pause at the top of your inhale for a few seconds and then exhale slowly, letting yourself relax. Repeat this three or four times. Practice yoga to help relax and work your muscles. Choose a stress reduction technique that works for you. These techniques take time and practice to develop. Set aside 5-15 minutes a day to do them. Therapists can offer training in these techniques. Do these things to help manage stress: Keep a journal. Know your limits. Set healthy boundaries for yourself and others, such as saying "no" when you think something is too much. Pay attention to how you react to certain situations. You may not be able to  control everything, but you can change your reaction. Add humor to your life by watching funny movies or shows. Make time for activities that you enjoy and that relax you. Spend less time using electronics, especially at night before bed. The light from screens can make your brain think it is time to get up rather than go to bed.  Medicines Medicines, such as antidepressants, are often a part of treatment for depression. Talk with your pharmacist or health care provider about all the medicines, supplements, and herbal products that you take, their possible side effects, and what medicines and other products are safe to take together. Make sure to report any side effects you may have to your health care provider. Relationships Your health care provider may suggest family therapy, couples therapy, or individual therapy as part of your treatment. How to recognize changes Everyone responds differently to treatment for depression. As you recover from depression, you may start to: Have more interest in doing activities. Feel more hopeful. Have more energy. Eat a more regular amount of food. Have better mental focus. It is important to recognize if your depression is not getting better or is getting worse. The symptoms you had in the beginning may return, such as: Feeling tired. Eating too much or too little. Sleeping too much or too little. Feeling restless, agitated, or hopeless. Trouble focusing or making decisions. Having unexplained aches and pains. Feeling irritable, angry, or aggressive. If you or your family members notice these symptoms coming back, let your health care provider know right away. Follow these instructions at home: Activity Try to   get some form of exercise each day, such as walking. Try yoga, mindfulness, or other stress reduction techniques. Participate in group activities if you are able. Lifestyle Get enough sleep. Cut down on or stop using caffeine, tobacco,  alcohol, and any other harmful substances. Eat a healthy diet that includes plenty of vegetables, fruits, whole grains, low-fat dairy products, and lean protein. Limit foods that are high in solid fats, added sugar, or salt (sodium). General instructions Take over-the-counter and prescription medicines only as told by your health care provider. Keep all follow-up visits. It is important for your health care provider to check on your mood, behavior, and medicines. Your health care provider may need to make changes to your treatment. Where to find support Talking to others  Friends and family members can be sources of support and guidance. Talk to trusted friends or family members about your condition. Explain your symptoms and let them know that you are working with a health care provider to treat your depression. Tell friends and family how they can help. Finances Find mental health providers that fit with your financial situation. Talk with your health care provider if you are worried about access to food, housing, or medicine. Call your insurance company to learn about your co-pays and prescription plan. Where to find more information You can find support in your area from: Anxiety and Depression Association of America (ADAA): adaa.org Mental Health America: mentalhealthamerica.net National Alliance on Mental Illness: nami.org Contact a health care provider if: You stop taking your antidepressant medicines, and you have any of these symptoms: Nausea. Headache. Light-headedness. Chills and body aches. Not being able to sleep (insomnia). You or your friends and family think your depression is getting worse. Get help right away if: You have thoughts of hurting yourself or others. Get help right away if you feel like you may hurt yourself or others, or have thoughts about taking your own life. Go to your nearest emergency room or: Call 911. Call the National Suicide Prevention Lifeline at  1-800-273-8255 or 988. This is open 24 hours a day. Text the Crisis Text Line at 741741. This information is not intended to replace advice given to you by your health care provider. Make sure you discuss any questions you have with your health care provider. Document Revised: 01/27/2022 Document Reviewed: 01/27/2022 Elsevier Patient Education  2023 Elsevier Inc.  

## 2023-01-01 ENCOUNTER — Ambulatory Visit: Payer: Medicaid Other | Admitting: Nurse Practitioner

## 2023-01-01 ENCOUNTER — Encounter: Payer: Self-pay | Admitting: Nurse Practitioner

## 2023-01-01 VITALS — BP 123/78 | HR 85 | Temp 98.1°F | Ht 69.02 in | Wt 285.3 lb

## 2023-01-01 DIAGNOSIS — Z6841 Body Mass Index (BMI) 40.0 and over, adult: Secondary | ICD-10-CM | POA: Diagnosis not present

## 2023-01-01 DIAGNOSIS — F411 Generalized anxiety disorder: Secondary | ICD-10-CM

## 2023-01-01 DIAGNOSIS — F330 Major depressive disorder, recurrent, mild: Secondary | ICD-10-CM | POA: Diagnosis not present

## 2023-01-01 MED ORDER — VALACYCLOVIR HCL 1 G PO TABS: 1000.0000 mg | ORAL_TABLET | Freq: Every day | ORAL | 4 refills | Status: DC

## 2023-01-01 MED ORDER — VENLAFAXINE HCL ER 75 MG PO CP24: 75.0000 mg | ORAL_CAPSULE | Freq: Two times a day (BID) | ORAL | 4 refills | Status: DC

## 2023-01-01 MED ORDER — BUSPIRONE HCL 15 MG PO TABS: 15.0000 mg | ORAL_TABLET | Freq: Two times a day (BID) | ORAL | 4 refills | Status: DC | PRN

## 2023-01-01 NOTE — Progress Notes (Signed)
BP 123/78   Pulse 85   Temp 98.1 F (36.7 C) (Oral)   Ht 5' 9.02" (1.753 m)   Wt 285 lb 4.8 oz (129.4 kg)   SpO2 98%   BMI 42.11 kg/m    Subjective:    Patient ID: Rachel Cooley, female    DOB: [birth year], 42 y.o.   MRN: [id number]  HPI: Rachel Cooley is a 42 y.o. female  Chief Complaint  Patient presents with   Mood   Weight Check   Follows with [facility] weight management with gastrectomy performed on 03/25/22.  Saw [facility] weight management last on 10/26/22 with goal to lose 43 pounds in one year.  Has lost total of 78 pounds -- she has hit road block.  Reports she needs to exercise more.  DEPRESSION Continues on Wellbutrin and Effexor.  She feels need to restart on Buspar, as feeling more irritable. Mood status: stable Satisfied with current treatment?: yes Symptom severity: moderate  Duration of current treatment : chronic Side effects: no Medication compliance: good compliance Psychotherapy/counseling: yes in the past Depressed mood: no Anxious mood: yes, on edge and irritable Anhedonia: no Significant weight loss or gain: no Insomnia: did not sleep well this week, on average sleeps well Fatigue: no Feelings of worthlessness or guilt: occasional Impaired concentration/indecisiveness: no Suicidal ideations: no Hopelessness: no Crying spells: yesterday    01/01/2023   11:12 AM 07/20/2022    3:08 PM 06/09/2022    4:12 PM 04/06/2022    4:31 PM 02/25/2022    2:56 PM  Depression screen PHQ 2/9  Decreased Interest 1 0 1 2 1   Down, Depressed, Hopeless 1 1 0 2 1  PHQ - 2 Score 2 1 1 4 2   Altered sleeping 1 1 1  0 1  Tired, decreased energy 1 0 1 1 1   Change in appetite 2 0 0 0 1  Feeling bad or failure about yourself  0 0 0 0 1  Trouble concentrating 0 0 0 0 0  Moving slowly or fidgety/restless 0 0 0 0 0  Suicidal thoughts 0 0 0 0 0  PHQ-9 Score 6 2 3 5 6   Difficult doing work/chores Somewhat difficult Not difficult at all Not difficult at all Not difficult at all         01/01/2023   11:13 AM 07/20/2022    3:08 PM 06/09/2022    4:13 PM 04/06/2022    4:32 PM  GAD 7 : Generalized Anxiety Score  Nervous, Anxious, on Edge 2 1 1 1   Control/stop worrying 1 0 0 0  Worry too much - different things 1 0 0 0  Trouble relaxing 1 0 0 1  Restless 0 0 0 0  Easily annoyed or irritable 2 1 1 2   Afraid - awful might happen 0 0 0 0  Total GAD 7 Score 7 2 2 4   Anxiety Difficulty Somewhat difficult  Not difficult at all Not difficult at all    Relevant past medical, surgical, family and social history reviewed and updated as indicated. Interim medical history since our last visit reviewed. Allergies and medications reviewed and updated.  Review of Systems  Constitutional:  Negative for activity change, appetite change, diaphoresis, fatigue and fever.  Respiratory:  Negative for cough, chest tightness and shortness of breath.   Cardiovascular:  Negative for chest pain, palpitations and leg swelling.  Gastrointestinal: Negative.   Neurological: Negative.   Psychiatric/Behavioral:  Positive for sleep disturbance. Negative for decreased concentration, self-injury  and suicidal ideas. The patient is nervous/anxious.     Per HPI unless specifically indicated above     Objective:    BP 123/78   Pulse 85   Temp 98.1 F (36.7 C) (Oral)   Ht 5' 9.02" (1.753 m)   Wt 285 lb 4.8 oz (129.4 kg)   SpO2 98%   BMI 42.11 kg/m   Wt Readings from Last 3 Encounters:  01/01/23 285 lb 4.8 oz (129.4 kg)  11/24/22 283 lb 3.2 oz (128.5 kg)  07/20/22 (!) 308 lb 3.2 oz (139.8 kg)    Physical Exam Vitals and nursing note reviewed.  Constitutional:      General: She is awake. She is not in acute distress.    Appearance: She is well-developed and well-groomed. She is morbidly obese. She is not ill-appearing or toxic-appearing.  HENT:     Head: Normocephalic.     Right Ear: Hearing normal.     Left Ear: Hearing normal.  Eyes:     General: Lids are normal.        Right eye:  No discharge.        Left eye: No discharge.     Conjunctiva/sclera: Conjunctivae normal.     Pupils: Pupils are equal, round, and reactive to light.  Neck:     Thyroid: No thyromegaly.     Vascular: No carotid bruit.  Cardiovascular:     Rate and Rhythm: Normal rate and regular rhythm.     Heart sounds: Normal heart sounds. No murmur heard.    No gallop.  Pulmonary:     Effort: Pulmonary effort is normal. No accessory muscle usage or respiratory distress.     Breath sounds: Normal breath sounds.  Abdominal:     General: Bowel sounds are normal.     Palpations: Abdomen is soft.  Musculoskeletal:     Cervical back: Normal range of motion and neck supple.     Right lower leg: No edema.     Left lower leg: No edema.  Skin:    General: Skin is warm and dry.  Neurological:     Mental Status: She is alert and oriented to person, place, and time.  Psychiatric:        Attention and Perception: Attention normal.        Mood and Affect: Mood normal.        Speech: Speech normal.        Behavior: Behavior normal. Behavior is cooperative.        Thought Content: Thought content normal.     Results for orders placed or performed in visit on 11/24/22  COVID-19, Flu A+B and RSV   Specimen: Nasopharyngeal(NP) swabs in vial transport medium   Nasopharynge  Previous  Result Value Ref Range   SARS-CoV-2, NAA Not Detected Not Detected   Influenza A, NAA Not Detected Not Detected   Influenza B, NAA Detected (A) Not Detected   RSV, NAA Not Detected Not Detected   Test Information: Comment       Assessment & Plan:   Problem List Items Addressed This Visit       Other   Class 3 severe obesity with serious comorbidity and body mass index (BMI) of 50.0 to 59.9 in adult ([place]) - Primary    BMI 42.11, continues to lose.  Is working with [employer], continue this collaboration.  Recent notes and labs reviewed.  Recent gastrectomy on 03/25/22.      Generalized anxiety disorder  Refer to depression plan of care.      Relevant Medications   busPIRone (BUSPAR) 15 MG tablet   venlafaxine XR (EFFEXOR-XR) 75 MG 24 hr capsule   Mild episode of recurrent major depressive disorder (HCC)    Chronic, ongoing.  Denies SI/HI.  Continue Wellbutrin 300 MG XL daily and Effexor XR 75 MG BID + restart Buspar due to increased irritability recently.  Discussed at length with patient.        Relevant Medications   busPIRone (BUSPAR) 15 MG tablet   venlafaxine XR (EFFEXOR-XR) 75 MG 24 hr capsule     Follow up plan: Return in about 23 weeks (around 06/11/2023) for Annual physical after 06/10/23.

## 2023-01-01 NOTE — Assessment & Plan Note (Signed)
Chronic, ongoing.  Denies SI/HI.  Continue Wellbutrin 300 MG XL daily and Effexor XR 75 MG BID + restart Buspar due to increased irritability recently.  Discussed at length with patient.

## 2023-01-01 NOTE — Assessment & Plan Note (Signed)
BMI 42.11, continues to lose.  Is working with [employer], continue this collaboration.  Recent notes and labs reviewed.  Recent gastrectomy on 03/25/22.

## 2023-01-01 NOTE — Assessment & Plan Note (Signed)
Refer to depression plan of care. 

## 2023-01-30 ENCOUNTER — Ambulatory Visit
Admission: EM | Admit: 2023-01-30 | Discharge: 2023-01-30 | Disposition: A | Discharge status: Home / Self Care | Payer: Medicaid Other | Attending: Urgent Care | Admitting: Urgent Care

## 2023-01-30 DIAGNOSIS — J020 Streptococcal pharyngitis: Secondary | ICD-10-CM | POA: Diagnosis not present

## 2023-01-30 DIAGNOSIS — J029 Acute pharyngitis, unspecified: Secondary | ICD-10-CM | POA: Diagnosis not present

## 2023-01-30 LAB — POCT RAPID STREP A (OFFICE): Rapid Strep A Screen: POSITIVE — AB

## 2023-01-30 MED ORDER — AMOXICILLIN 500 MG PO CAPS: 500.0000 mg | ORAL_CAPSULE | Freq: Two times a day (BID) | ORAL | 0 refills | Status: AC

## 2023-01-30 NOTE — ED Triage Notes (Signed)
Patent presents to UC for bilateral ear pain and sore throat since yesterday. Taking tylenol and ibuprofen.

## 2023-01-30 NOTE — Discharge Instructions (Signed)
Follow up here or with your primary care provider if your symptoms are worsening or not improving with treatment.     

## 2023-01-30 NOTE — ED Provider Notes (Signed)
Rachel Cooley    CSN: [id number] Arrival date & time: 01/30/23  1139      History   Chief Complaint Chief Complaint  Patient presents with   Otalgia   Sore Throat    HPI Rachel Cooley is a 42 y.o. female.    Otalgia Sore Throat    Patient presents to urgent care with complaint of bilateral ear pain and sore throat since yesterday.  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   Osteoarthritis of knee 02/16/2022   OSA on CPAP 12/01/2021   Class 3 severe obesity with serious comorbidity and body mass index (BMI) of 50.0 to 59.9 in adult (HCC) 04/24/2021   Hyperlipidemia 03/17/2021   Insulin resistance 04/03/2020   Vitamin D deficiency 04/03/2020   Mild episode of recurrent major depressive disorder (HCC) 04/28/2019   Generalized anxiety disorder 04/28/2019   GERD (gastroesophageal reflux disease) 04/28/2019   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 Authorizing Provider   buPROPion (WELLBUTRIN XL) 300 MG 24 hr tablet Take 1 tablet (300 mg total) by mouth daily. 12/25/22   Cannady, Corrie Dandy T, NP  busPIRone (BUSPAR) 15 MG tablet Take 1 tablet (15 mg total) by mouth 2 (two) times daily as needed. 01/01/23   Cannady, Corrie Dandy T, NP  cetirizine (ZYRTEC) 10 MG tablet Take by mouth.    [provider]  docusate sodium (COLACE) 100 MG capsule Take 100 mg by mouth at bedtime.    [provider]  omeprazole (PRILOSEC) 40 MG capsule Take 1 capsule (40 mg total) by mouth daily. 06/09/22   Aura Dials T, NP  paragard intrauterine copper IUD IUD by Intrauterine route.    [provider]  valACYclovir (VALTREX) 1000 MG tablet Take 1 tablet (1,000 mg total) by mouth daily. 01/01/23   Cannady, Corrie Dandy T, NP  venlafaxine XR (EFFEXOR-XR) 75 MG 24 hr capsule Take 1 capsule (75 mg total) by mouth 2 (two) times daily. 01/01/23   Marjie Skiff, NP    Family History Family History  Problem Relation Age of Onset   Hypertension Mother  Hyperlipidemia Mother    Arthritis Mother    Diabetes Mother    Depression Mother    Anxiety disorder Mother    Obesity Mother    Hypertension Father    Arthritis Father    Heart disease Father    Obesity Father    Brain cancer Maternal Grandmother    Lung cancer Maternal Grandfather    Brain cancer Paternal Grandfather    Stroke Neg Hx     Social History Social History   Tobacco Use   Smoking status: Former    Types: Cigarettes   Smokeless tobacco: Never   Tobacco comments:    stopped with +UPT  Vaping Use   Vaping Use: Never used  Substance Use Topics   Alcohol use: No   Drug use: No     Allergies   Patient has no known allergies.   Review of Systems Review of Systems  HENT:  Positive for ear pain.      Physical Exam Triage Vital Signs ED Triage Vitals  Enc Vitals Group     BP 01/30/23 1146 124/86     Pulse Rate 01/30/23 1146 97     Resp 01/30/23 1146 18     Temp 01/30/23 1146 98.1 F  (36.7 C)     Temp Source 01/30/23 1146 Oral     SpO2 01/30/23 1146 96 %     Weight --      Height --      Head Circumference --      Peak Flow --      Pain Score 01/30/23 1147 7     Pain Loc --      Pain Edu? --      Excl. in GC? --    No data found.  Updated Vital Signs BP 124/86 (BP Location: Left Arm)   Pulse 97   Temp 98.1 F (36.7 C) (Oral)   Resp 18   SpO2 96%   Visual Acuity Right Eye Distance:   Left Eye Distance:   Bilateral Distance:    Right Eye Near:   Left Eye Near:    Bilateral Near:     Physical Exam Vitals reviewed.  Constitutional:      Appearance: She is well-developed.  HENT:     Right Ear: Tympanic membrane normal.     Left Ear: Tympanic membrane normal.     Mouth/Throat:     Pharynx: Posterior oropharyngeal erythema present. No oropharyngeal exudate.     Tonsils: 2+ on the right. 2+ on the left.  Skin:    General: Skin is warm and dry.  Neurological:     General: No focal deficit present.     Mental Status: She is alert and oriented to person, place, and time.  Psychiatric:        Mood and Affect: Mood normal.        Behavior: Behavior normal.      UC Treatments / Results  Labs (all labs ordered are listed, but only abnormal results are displayed) Labs Reviewed - No data to display  EKG   Radiology No results found.  Procedures Procedures (including critical care time)  Medications Ordered in UC Medications - No data to display  Initial Impression / Assessment and Plan / UC Course  I have reviewed the triage vital signs and the nursing notes.  Pertinent labs & imaging results that were available during my care of the patient were reviewed by me and considered in my medical decision making (  see chart for details).   Rachel Cooley is a 42 y.o. female presenting with sore throat. Patient is afebrile without recent antipyretics, satting well on room air. Overall is well appearing though non-toxic, well hydrated, without  respiratory distress.  Pharyngeal erythema is present without peritonsillar exudates.  Tonsils are 2-3+ bilaterally.  Rapid strep result is positive. Will treat with amoxicillin.  Supportive care with use of OTC medication for symptom control is recommended as needed.  Counseled patient on potential for adverse effects with medications prescribed/recommended today, ER and return-to-clinic precautions discussed, patient verbalized understanding and agreement with care plan.    Final Clinical Impressions(s) / UC Diagnoses   Final diagnoses:  None   Discharge Instructions   None    ED Prescriptions   None    PDMP not reviewed this encounter.   Charma Igo, [place] 01/30/23 1215

## 2023-04-02 DIAGNOSIS — Z1231 Encounter for screening mammogram for malignant neoplasm of breast: Secondary | ICD-10-CM | POA: Diagnosis not present

## 2023-04-02 DIAGNOSIS — Z1321 Encounter for screening for nutritional disorder: Secondary | ICD-10-CM | POA: Diagnosis not present

## 2023-04-02 DIAGNOSIS — Z6841 Body Mass Index (BMI) 40.0 and over, adult: Secondary | ICD-10-CM | POA: Diagnosis not present

## 2023-04-02 DIAGNOSIS — K219 Gastro-esophageal reflux disease without esophagitis: Secondary | ICD-10-CM | POA: Diagnosis not present

## 2023-04-02 DIAGNOSIS — Z9884 Bariatric surgery status: Secondary | ICD-10-CM | POA: Diagnosis not present

## 2023-04-02 DIAGNOSIS — R7989 Other specified abnormal findings of blood chemistry: Secondary | ICD-10-CM | POA: Diagnosis not present

## 2023-06-07 NOTE — Patient Instructions (Addendum)
STOP BUSPAR Switch to hydroxyzine 10mg  - can take as need max 3 a day, don't take with zyrtec  Be Involved in Caring For Your Health:  Taking Medications When medications are taken as directed, they can greatly improve your health. But if they are not taken as prescribed, they may not work. In some cases, not taking them correctly can be harmful. To help ensure your treatment remains effective and safe, understand your medications and how to take them. Bring your medications to each visit for review by your provider.  Your lab results, notes, and after visit summary will be available on My Chart. We strongly encourage you to use this feature. If lab results are abnormal the clinic will contact you with the appropriate steps. If the clinic does not contact you assume the results are satisfactory. You can always view your results on My Chart. If you have questions regarding your health or results, please contact the clinic during office hours. You can also ask questions on My Chart.  We at [facility] are grateful that you chose [place] to provide your care. We strive to provide evidence-based and compassionate care and are always looking for feedback. If you get a survey from the clinic please complete this so we can hear your opinions.  Healthy Eating, Adult Healthy eating may help you get and keep a healthy body weight, reduce the risk of chronic disease, and live a long and productive life. It is important to follow a healthy eating pattern. Your nutritional and calorie needs should be met mainly by different nutrient-rich foods. What are tips for following this plan? Reading food labels Read labels and choose the following: Reduced or low sodium products. Juices with 100% fruit juice. Foods with low saturated fats (<3 g per serving) and high polyunsaturated and monounsaturated fats. Foods with whole grains, such as whole wheat, cracked wheat, brown rice, and wild rice. Whole grains  that are fortified with folic acid. This is recommended for females who are pregnant or who want to become pregnant. Read labels and do not eat or drink the following: Foods or drinks with added sugars. These include foods that contain brown sugar, corn sweetener, corn syrup, dextrose, fructose, glucose, high-fructose corn syrup, honey, invert sugar, lactose, malt syrup, maltose, molasses, raw sugar, sucrose, trehalose, or turbinado sugar. Limit your intake of added sugars to less than 10% of your total daily calories. Do not eat more than the following amounts of added sugar per day: 6 teaspoons (25 g) for females. 9 teaspoons (38 g) for males. Foods that contain processed or refined starches and grains. Refined grain products, such as white flour, degermed cornmeal, white bread, and white rice. Shopping Choose nutrient-rich snacks, such as vegetables, whole fruits, and nuts. Avoid high-calorie and high-sugar snacks, such as potato chips, fruit snacks, and candy. Use oil-based dressings and spreads on foods instead of solid fats such as butter, margarine, sour cream, or cream cheese. Limit pre-made sauces, mixes, and "instant" products such as flavored rice, instant noodles, and ready-made pasta. Try more plant-protein sources, such as tofu, tempeh, black beans, edamame, lentils, nuts, and seeds. Explore eating plans such as the Mediterranean diet or vegetarian diet. Try heart-healthy dips made with beans and healthy fats like hummus and guacamole. Vegetables go great with these. Cooking Use oil to saut or stir-fry foods instead of solid fats such as butter, margarine, or lard. Try baking, boiling, grilling, or broiling instead of frying. Remove the fatty part of meats before  cooking. Steam vegetables in water or broth. Meal planning  At meals, imagine dividing your plate into fourths: One-half of your plate is fruits and vegetables. One-fourth of your plate is whole grains. One-fourth of  your plate is protein, especially lean meats, poultry, eggs, tofu, beans, or nuts. Include low-fat dairy as part of your daily diet. Lifestyle Choose healthy options in all settings, including home, work, school, restaurants, or stores. Prepare your food safely: Wash your hands after handling raw meats. Where you prepare food, keep surfaces clean by regularly washing with hot, soapy water. Keep raw meats separate from ready-to-eat foods, such as fruits and vegetables. Cook seafood, meat, poultry, and eggs to the recommended temperature. Get a food thermometer. Store foods at safe temperatures. In general: Keep cold foods at 46F (4.4C) or below. Keep hot foods at 146F (60C) or above. Keep your freezer at [facility] (-17.8C) or below. Foods are not safe to eat if they have been between the temperatures of 40-146F (4.4-60C) for more than 2 hours. What foods should I eat? Fruits Aim to eat 1-2 cups of fresh, canned (in natural juice), or frozen fruits each day. One cup of fruit equals 1 small apple, 1 large banana, 8 large strawberries, 1 cup (237 g) canned fruit,  cup (82 g) dried fruit, or 1 cup (240 mL) 100% juice. Vegetables Aim to eat 2-4 cups of fresh and frozen vegetables each day, including different varieties and colors. One cup of vegetables equals 1 cup (91 g) broccoli or cauliflower florets, 2 medium carrots, 2 cups (150 g) raw, leafy greens, 1 large tomato, 1 large bell pepper, 1 large sweet potato, or 1 medium white potato. Grains Aim to eat 5-10 ounce-equivalents of whole grains each day. Examples of 1 ounce-equivalent of grains include 1 slice of bread, 1 cup (40 g) ready-to-eat cereal, 3 cups (24 g) popcorn, or  cup (93 g) cooked rice. Meats and other proteins Try to eat 5-7 ounce-equivalents of protein each day. Examples of 1 ounce-equivalent of protein include 1 egg,  oz nuts (12 almonds, 24 pistachios, or 7 walnut halves), 1/4 cup (90 g) cooked beans, 6 tablespoons (90 g)  hummus or 1 tablespoon (16 g) peanut butter. A cut of meat or fish that is the size of a deck of cards is about 3-4 ounce-equivalents (85 g). Of the protein you eat each week, try to have at least 8 sounce (227 g) of seafood. This is about 2 servings per week. This includes salmon, trout, herring, sardines, and anchovies. Dairy Aim to eat 3 cup-equivalents of fat-free or low-fat dairy each day. Examples of 1 cup-equivalent of dairy include 1 cup (240 mL) milk, 8 ounces (250 g) yogurt, 1 ounces (44 g) natural cheese, or 1 cup (240 mL) fortified soy milk. Fats and oils Aim for about 5 teaspoons (21 g) of fats and oils per day. Choose monounsaturated fats, such as canola and olive oils, mayonnaise made with olive oil or avocado oil, avocados, peanut butter, and most nuts, or polyunsaturated fats, such as sunflower, corn, and soybean oils, walnuts, pine nuts, sesame seeds, sunflower seeds, and flaxseed. Beverages Aim for 6 eight-ounce glasses of water per day. Limit coffee to 3-5 eight-ounce cups per day. Limit caffeinated beverages that have added calories, such as soda and energy drinks. If you drink alcohol: Limit how much you have to: 0-1 drink a day if you are female. 0-2 drinks a day if you are female. Know how much alcohol is in your drink.  In the [place] one drink is one 12 oz bottle of beer (355 mL), one 5 oz glass of wine (148 mL), or one 1 oz glass of hard liquor (44 mL). Seasoning and other foods Try not to add too much salt to your food. Try using herbs and spices instead of salt. Try not to add sugar to food. This information is based on [place] nutrition guidelines. To learn more, visit DisposableN[employer].be. Exact amounts may vary. You may need different amounts. This information is not intended to replace advice given to you by your health care provider. Make sure you discuss any questions you have with your health care provider. Document Revised: 06/22/2022 Document Reviewed:  06/22/2022 Elsevier Patient Education  2024 [employer].

## 2023-06-10 ENCOUNTER — Encounter: Payer: Self-pay | Admitting: Nurse Practitioner

## 2023-06-11 ENCOUNTER — Encounter: Payer: Self-pay | Admitting: Pediatrics

## 2023-06-11 ENCOUNTER — Ambulatory Visit (INDEPENDENT_AMBULATORY_CARE_PROVIDER_SITE_OTHER): Payer: Medicaid Other | Admitting: Pediatrics

## 2023-06-11 VITALS — BP 130/82 | HR 67 | Temp 98.3°F | Ht 69.0 in | Wt 289.0 lb

## 2023-06-11 DIAGNOSIS — Z0000 Encounter for general adult medical examination without abnormal findings: Secondary | ICD-10-CM | POA: Diagnosis not present

## 2023-06-11 DIAGNOSIS — D508 Other iron deficiency anemias: Secondary | ICD-10-CM

## 2023-06-11 DIAGNOSIS — E782 Mixed hyperlipidemia: Secondary | ICD-10-CM

## 2023-06-11 DIAGNOSIS — Z1231 Encounter for screening mammogram for malignant neoplasm of breast: Secondary | ICD-10-CM

## 2023-06-11 DIAGNOSIS — F411 Generalized anxiety disorder: Secondary | ICD-10-CM

## 2023-06-11 DIAGNOSIS — G4700 Insomnia, unspecified: Secondary | ICD-10-CM | POA: Diagnosis not present

## 2023-06-11 DIAGNOSIS — Z6841 Body Mass Index (BMI) 40.0 and over, adult: Secondary | ICD-10-CM | POA: Diagnosis not present

## 2023-06-11 DIAGNOSIS — G4733 Obstructive sleep apnea (adult) (pediatric): Secondary | ICD-10-CM

## 2023-06-11 DIAGNOSIS — E559 Vitamin D deficiency, unspecified: Secondary | ICD-10-CM

## 2023-06-11 DIAGNOSIS — F330 Major depressive disorder, recurrent, mild: Secondary | ICD-10-CM

## 2023-06-11 DIAGNOSIS — Z23 Encounter for immunization: Secondary | ICD-10-CM

## 2023-06-11 MED ORDER — HYDROXYZINE HCL 10 MG PO TABS
10.0000 mg | ORAL_TABLET | Freq: Two times a day (BID) | ORAL | 0 refills | Status: DC | PRN
Start: 2023-06-11 — End: 2023-07-01
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
## 2023-06-18 ENCOUNTER — Encounter: Payer: Self-pay | Admitting: Pediatrics

## 2023-06-18 ENCOUNTER — Ambulatory Visit: Payer: Medicaid Other | Admitting: Pediatrics

## 2023-06-18 VITALS — BP 101/72 | HR 74 | Temp 98.0°F | Ht 69.0 in | Wt 285.6 lb

## 2023-06-18 DIAGNOSIS — F909 Attention-deficit hyperactivity disorder, unspecified type: Secondary | ICD-10-CM

## 2023-06-18 DIAGNOSIS — E782 Mixed hyperlipidemia: Secondary | ICD-10-CM | POA: Diagnosis not present

## 2023-06-18 DIAGNOSIS — F330 Major depressive disorder, recurrent, mild: Secondary | ICD-10-CM | POA: Diagnosis not present

## 2023-06-18 DIAGNOSIS — D508 Other iron deficiency anemias: Secondary | ICD-10-CM | POA: Diagnosis not present

## 2023-06-18 MED ORDER — BUPROPION HCL ER (XL) 150 MG PO TB24: 150.0000 mg | ORAL_TABLET | Freq: Every day | ORAL | 0 refills | Status: DC
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
## 2023-06-18 NOTE — Patient Instructions (Addendum)
Week 1-2: Reduce to 150 mg daily. Week 3-4: message me then reduce to 75 mg daily. Then off. We will have our appointment then.

## 2023-06-19 LAB — CBC WITH DIFFERENTIAL/PLATELET
Basophils Absolute: 0.1 10*3/uL (ref 0.0–0.2)
Basos: 1 %
EOS (ABSOLUTE): 0.2 10*3/uL (ref 0.0–0.4)
Eos: 2 %
Hematocrit: 40.7 % (ref 34.0–46.6)
Hemoglobin: 13 g/dL (ref 11.1–15.9)
Immature Grans (Abs): 0 10*3/uL (ref 0.0–0.1)
Immature Granulocytes: 0 %
Lymphocytes Absolute: 1.7 10*3/uL (ref 0.7–3.1)
Lymphs: 24 %
MCH: 29.6 pg (ref 26.6–33.0)
MCHC: 31.9 g/dL (ref 31.5–35.7)
MCV: 93 fL (ref 79–97)
Monocytes Absolute: 0.4 10*3/uL (ref 0.1–0.9)
Monocytes: 6 %
Neutrophils Absolute: 4.8 10*3/uL (ref 1.4–7.0)
Neutrophils: 67 %
Platelets: 263 10*3/uL (ref 150–450)
RBC: 4.39 x10E6/uL (ref 3.77–5.28)
RDW: 13.3 % (ref 11.7–15.4)
WBC: 7.2 10*3/uL (ref 3.4–10.8)

## 2023-06-19 LAB — FERRITIN: Ferritin: 30 ng/mL (ref 15–150)

## 2023-06-19 LAB — COMPREHENSIVE METABOLIC PANEL
ALT: 16 IU/L (ref 0–32)
AST: 18 IU/L (ref 0–40)
Albumin: 4.4 g/dL (ref 3.9–4.9)
Alkaline Phosphatase: 63 IU/L (ref 44–121)
BUN/Creatinine Ratio: 24 — ABNORMAL HIGH (ref 9–23)
BUN: 19 mg/dL (ref 6–24)
Bilirubin Total: 0.4 mg/dL (ref 0.0–1.2)
CO2: 21 mmol/L (ref 20–29)
Calcium: 9.4 mg/dL (ref 8.7–10.2)
Chloride: 103 mmol/L (ref 96–106)
Creatinine, Ser: 0.79 mg/dL (ref 0.57–1.00)
Globulin, Total: 2 g/dL (ref 1.5–4.5)
Glucose: 94 mg/dL (ref 70–99)
Potassium: 4.4 mmol/L (ref 3.5–5.2)
Sodium: 139 mmol/L (ref 134–144)
Total Protein: 6.4 g/dL (ref 6.0–8.5)
eGFR: 97 mL/min/{1.73_m2} (ref >59)

## 2023-06-19 LAB — LIPID PANEL W/O CHOL/HDL RATIO
Cholesterol, Total: 246 mg/dL — ABNORMAL HIGH (ref 100–199)
HDL: 57 mg/dL (ref >39)
LDL Chol Calc (NIH): 168 mg/dL — ABNORMAL HIGH (ref 0–99)
Triglycerides: 119 mg/dL (ref 0–149)
VLDL Cholesterol Cal: 21 mg/dL (ref 5–40)

## 2023-06-19 LAB — IRON AND TIBC
Iron Saturation: 20 % (ref 15–55)
Iron: 62 ug/dL (ref 27–159)
Total Iron Binding Capacity: 303 ug/dL (ref 250–450)
UIBC: 241 ug/dL (ref 131–425)

## 2023-07-01 ENCOUNTER — Other Ambulatory Visit: Payer: Self-pay | Admitting: Pediatrics

## 2023-07-01 DIAGNOSIS — G4700 Insomnia, unspecified: Secondary | ICD-10-CM

## 2023-07-01 NOTE — Telephone Encounter (Signed)
Requested Prescriptions  Pending Prescriptions Disp Refills   hydrOXYzine (ATARAX) 10 MG tablet [Pharmacy Med Name: HYDROXYZINE HCL 10 MG TABLET] 30 tablet 0    Sig: TAKE 1 TABLET (10 MG TOTAL) BY MOUTH TWICE A DAY AS NEEDED FOR ANXIETY     Ear, Nose, and Throat:  Antihistamines 2 Passed - 07/01/2023 11:33 AM      Passed - Cr in normal range and within 360 days    Creatinine, Ser  Date Value Ref Range Status  06/18/2023 0.79 0.57 - 1.00 mg/dL Final         Passed - Valid encounter within last 12 months    Recent Outpatient Visits           1 week ago Adult ADHD (attention deficit hyperactivity disorder)   [place] [facility] Jackolyn Confer, MD   2 weeks ago Encounter for annual physical exam   [place] [facility] Jackolyn Confer, MD   6 months ago Class 3 severe obesity due to excess calories with serious comorbidity and body mass index (BMI) of 50.0 to 59.9 in adult [facility])   [place] [facility] [place], [place] T, NP   7 months ago Sinus congestion   [place] [facility] [place], Iv[place] T, NP   11 months ago Class 3 severe obesity due to excess calories with serious comorbidity and body mass index (BMI) of 50.0 to 59.9 in adult [facility])    [facility] Marjie Skiff, NP       Future Appointments             In 2 weeks Evelene Croon, Atilano Median, MD [facility], [facility]

## 2023-07-13 ENCOUNTER — Other Ambulatory Visit: Payer: Self-pay | Admitting: Pediatrics

## 2023-07-13 ENCOUNTER — Other Ambulatory Visit: Payer: Self-pay | Admitting: Nurse Practitioner

## 2023-07-13 DIAGNOSIS — G4700 Insomnia, unspecified: Secondary | ICD-10-CM

## 2023-07-13 NOTE — Telephone Encounter (Signed)
Requested Prescriptions  Pending Prescriptions Disp Refills   hydrOXYzine (ATARAX) 10 MG tablet [Pharmacy Med Name: HYDROXYZINE HCL 10 MG TABLET] 30 tablet 0    Sig: TAKE 1 TABLET (10 MG TOTAL) BY MOUTH TWICE A DAY AS NEEDED FOR ANXIETY     Ear, Nose, and Throat:  Antihistamines 2 Passed - 07/13/2023 10:50 AM      Passed - Cr in normal range and within 360 days    Creatinine, Ser  Date Value Ref Range Status  06/18/2023 0.79 0.57 - 1.00 mg/dL Final         Passed - Valid encounter within last 12 months    Recent Outpatient Visits           3 weeks ago Adult ADHD (attention deficit hyperactivity disorder)   [place] [facility] Jack[facility]yn Confer, MD   1 month ago Encounter for annual physical exam   [place] [facility] Jackolyn Confer, MD   6 months ago Class 3 severe obesity due to excess calories with serious comorbidity and body mass index (BMI) of 50.0 to 59.9 in adult [facility])   [place] [place], [place] T, NP   7 months ago Sinus congestion   [facility] [place], [place] T, NP   11 months ago Class 3 severe obesity due to excess calories with serious comorbidity and body mass index (BMI) of 50.0 to 59.9 in adult [facility])   [place] [facility] Marjie Skiff, NP       Future Appointments             In 3 days Evelene Croon, Atilano Median, MD [facility], [facility]

## 2023-07-14 NOTE — Telephone Encounter (Signed)
Requested Prescriptions  Pending Prescriptions Disp Refills   omeprazole (PRILOSEC) 40 MG capsule [Pharmacy Med Name: OMEPRAZOLE DR 40 MG CAPSULE] 90 capsule 4    Sig: TAKE 1 CAPSULE (40 MG TOTAL) BY MOUTH DAILY.     Gastroenterology: Proton Pump Inhibitors Passed - 07/13/2023  9:45 PM      Passed - Valid encounter within last 12 months    Recent Outpatient Visits           3 weeks ago Adult ADHD (attention deficit hyperactivity disorder)   [facility]   1 month ago Encounter for annual physical exam   [place] [facility] Jackolyn Confer, MD   6 months ago Class 3 severe obesity due to excess calories with serious comorbidity and body mass index (BMI) of 50.0 to 59.9 in adult [facility])   [place] [facility] [place], Corrie Dandy T, NP   7 months ago Sinus congestion   [place] [facility] [place], Terry T, NP   11 months ago Class 3 severe obesity due to excess calories with serious comorbidity and body mass index (BMI) of 50.0 to 59.9 in adult [facility])   [place] [facility] Marjie Skiff, NP       Future Appointments             In 2 days Evelene Croon, Atilano Median, MD [facility], [facility]

## 2023-07-16 ENCOUNTER — Telehealth: Payer: Medicaid Other | Admitting: Pediatrics

## 2023-07-16 ENCOUNTER — Other Ambulatory Visit: Payer: Self-pay | Admitting: Pediatrics

## 2023-07-16 ENCOUNTER — Encounter: Payer: Self-pay | Admitting: Pediatrics

## 2023-07-16 DIAGNOSIS — F330 Major depressive disorder, recurrent, mild: Secondary | ICD-10-CM | POA: Diagnosis not present

## 2023-07-16 DIAGNOSIS — K219 Gastro-esophageal reflux disease without esophagitis: Secondary | ICD-10-CM

## 2023-07-16 DIAGNOSIS — F909 Attention-deficit hyperactivity disorder, unspecified type: Secondary | ICD-10-CM | POA: Diagnosis not present

## 2023-07-16 MED ORDER — OMEPRAZOLE 40 MG PO CPDR
40.0000 mg | DELAYED_RELEASE_CAPSULE | Freq: Two times a day (BID) | ORAL | 3 refills | Status: DC
Start: 2023-07-16 — End: 2024-07-13

## 2023-07-16 MED ORDER — METHYLPHENIDATE HCL ER (OSM) 18 MG PO TBCR
18.0000 mg | EXTENDED_RELEASE_TABLET | Freq: Every day | ORAL | 0 refills | Status: DC
Start: 2023-07-16 — End: 2023-08-18

## 2023-07-16 NOTE — Progress Notes (Signed)
Sent refills omeprazole BID

## 2023-07-16 NOTE — Telephone Encounter (Signed)
Requested medication (s) are due for refill today -yes  Requested medication (s) are on the active medication list -yes  Future visit scheduled -today  Last refill: 06/18/23 #30  Notes to clinic: pharmacy request 90 days supply- requires changes to original Rx- patient has appointment today- no notes yet- sent for review of request   Requested Prescriptions  Pending Prescriptions Disp Refills   buPROPion (WELLBUTRIN XL) 150 MG 24 hr tablet [Pharmacy Med Name: BUPROPION HCL XL 150 MG TABLET] 90 tablet 1    Sig: TAKE 1 TABLET BY MOUTH EVERY DAY     Psychiatry: Antidepressants - bupropion Passed - 07/16/2023 10:31 AM      Passed - Cr in normal range and within 360 days    Creatinine, Ser  Date Value Ref Range Status  06/18/2023 0.79 0.57 - 1.00 mg/dL Final         Passed - AST in normal range and within 360 days    AST  Date Value Ref Range Status  06/18/2023 18 0 - 40 IU/L Final         Passed - ALT in normal range and within 360 days    ALT  Date Value Ref Range Status  06/18/2023 16 0 - 32 IU/L Final         Passed - Completed PHQ-2 or PHQ-9 in the last 360 days      Passed - Last BP in normal range    BP Readings from Last 1 Encounters:  06/18/23 101/72         Passed - Valid encounter within last 6 months    Recent Outpatient Visits           Today Adult ADHD (attention deficit hyperactivity disorder)   [place] [facility] Jackolyn Confer, MD   4 weeks ago Adult ADHD (attention deficit hyperactivity disorder)   [place] Jack[facility]yn Confer, MD   1 month ago Encounter for annual physical exam   [place] [facility] Jackolyn Confer, MD   6 months ago Class 3 severe obesity due to excess calories with serious comorbidity and body mass index (BMI) of 50.0 to 59.9 in adult [facility])   [place] [facility] [place], [place] T, NP   7 months ago Sinus congestion    [facility]  [place], [place] T, NP                 Requested Prescriptions  Pending Prescriptions Disp Refills   buPROPion (WELLBUTRIN XL) 150 MG 24 hr tablet [Pharmacy Med Name: BUPROPION HCL XL 150 MG TABLET] 90 tablet 1    Sig: TAKE 1 TABLET BY MOUTH EVERY DAY     Psychiatry: Antidepressants - bupropion Passed - 07/16/2023 10:31 AM      Passed - Cr in normal range and within 360 days    Creatinine, Ser  Date Value Ref Range Status  06/18/2023 0.79 0.57 - 1.00 mg/dL Final         Passed - AST in normal range and within 360 days    AST  Date Value Ref Range Status  06/18/2023 18 0 - 40 IU/L Final         Passed - ALT in normal range and within 360 days    ALT  Date Value Ref Range Status  06/18/2023 16 0 - 32 IU/L Final         Passed - Completed PHQ-2 or PHQ-9  in the last 360 days      Passed - Last BP in normal range    BP Readings from Last 1 Encounters:  06/18/23 101/72         Passed - Valid encounter within last 6 months    Recent Outpatient Visits           Today Adult ADHD (attention deficit hyperactivity disorder)   [place] [facility] Jack[facility]yn Confer, MD   4 weeks ago Adult ADHD (attention deficit hyperactivity disorder)   [place] [facility] Jackolyn Confer, MD   1 month ago Encounter for annual physical exam   [place] [facility] Jackolyn Confer, MD   6 months ago Class 3 severe obesity due to excess calories with serious comorbidity and body mass index (BMI) of 50.0 to 59.9 in adult [facility])   [place] [facility] [place], Corrie Dandy T, NP   7 months ago Sinus congestion   [place] [facility] [place], Dorie Rank, NP

## 2023-07-16 NOTE — Progress Notes (Signed)
Telehealth Visit  I connected with  Rachel Cooley on 07/19/23 by a video enabled telemedicine application and verified that I am speaking with the correct person using two identifiers.   I discussed the limitations of evaluation and management by telemedicine. The patient expressed understanding and agreed to proceed.  Subjective:    Patient ID: Rachel Cooley, female    DOB: 1981-07-30, 42 y.o.   MRN: [id number]  HPI: Rachel Cooley is a 42 y.o. female  Chief Complaint  Patient presents with   ADHD   #ADHD Has completed most of wean of welbutrin without issues, has no noticed any breakthrough mood symptoms Currently has been on 1 week of 75mg , she is not sure anxiety has improved Has not had issues sleeping since hydroxyzine She is interested in initiating treatment as discussed last visit No SI/HI Interested in switching SSRI therapy as mentioned last visit as well. Wondering about timing  Relevant past medical, surgical, family and social history reviewed and updated as indicated. Interim medical history since our last visit reviewed. Allergies and medications reviewed and updated.  ROS per HPI unless specifically indicated above     Objective:    There were no vitals taken for this visit.  Wt Readings from Last 3 Encounters:  06/18/23 285 lb 9.6 oz (129.5 kg)  06/11/23 289 lb (131.1 kg)  01/01/23 285 lb 4.8 oz (129.4 kg)     Physical Exam Constitutional:      General: She is not in acute distress.    Appearance: Normal appearance.  Neurological:     General: No focal deficit present.     Mental Status: She is alert. Mental status is at baseline.     LIMITED EXAM GIVEN VIDEO VISIT     Assessment & Plan:  Assessment & Plan   Adult ADHD (attention deficit hyperactivity disorder) Assessment & Plan: Patient completed evaluation last visit and meet criteria by inattentive and hyperactive symptoms with symptoms present in childhood as well. Has had  h/o gastric sleeve thus will evaluate if concerta will be effective. If ineffective, consider short acting agent or transdermal formulation. Instructed to discontinue welbutrin. Continue ADHD behavioral modifications.  Orders: -     Methylphenidate HCl ER (OSM); Take 1 tablet (18 mg total) by mouth daily.  Dispense: 30 tablet; Refill: 0  Mild episode of recurrent major depressive disorder (HCC) Assessment & Plan: Stable on effexor therapy. No changes since coming down on welbutrin. Given breakthrough symptoms, primarily concentration and irritability, may benefit from switching SSRI therapy after initiating ADHD treatment. Continue to monitor as we initiate ADHD treatment.     Follow up plan: Return in about 2 weeks (around 07/30/2023).  Reymond Maynez P Arianna Delsanto, MD   This visit was completed via video visit through MyChart due to the restrictions of the COVID-19 pandemic. All issues as above were discussed and addressed. Physical exam was done as above through visual confirmation on video through MyChart. If it was felt that the patient should be evaluated in the office, they were directed there. The patient verbally consented to this visit."} Location of the patient: home Location of the provider: work Those involved with this call:  Provider: Modena Nunnery, MD CMA:  Maggie Font, CMA Time spent on call:  15 minutes with patient face to face via video conference. More than 50% of this time was spent in counseling and coordination of care. 15 minutes total spent in review of patient's record and preparation of their chart.

## 2023-07-17 ENCOUNTER — Other Ambulatory Visit: Payer: Self-pay | Admitting: Nurse Practitioner

## 2023-07-17 DIAGNOSIS — G4700 Insomnia, unspecified: Secondary | ICD-10-CM

## 2023-07-19 ENCOUNTER — Encounter: Payer: Self-pay | Admitting: Pediatrics

## 2023-07-19 NOTE — Assessment & Plan Note (Addendum)
Patient completed evaluation last visit and meet criteria by inattentive and hyperactive symptoms with symptoms present in childhood as well. Has had h/o gastric sleeve thus will evaluate if concerta will be effective. If ineffective, consider short acting agent or transdermal formulation. Instructed to discontinue welbutrin. Continue ADHD behavioral modifications.

## 2023-07-19 NOTE — Assessment & Plan Note (Addendum)
Stable on effexor therapy. No changes since coming down on welbutrin. Given breakthrough symptoms, primarily concentration and irritability, may benefit from switching SSRI therapy after initiating ADHD treatment. Continue to monitor as we initiate ADHD treatment.

## 2023-07-19 NOTE — Telephone Encounter (Signed)
Requested medications are due for refill today.  yes  Requested medications are on the active medications list.  yes  Last refill. 07/13/2023 #30 0 rf  Future visit scheduled.   yes  Notes to clinic.  Pt is being prescribed a very small amount - 2 week supply. Unsure why - please review for refill.    Requested Prescriptions  Pending Prescriptions Disp Refills   hydrOXYzine (ATARAX) 10 MG tablet [Pharmacy Med Name: HYDROXYZINE HCL 10 MG TABLET] 180 tablet 1    Sig: TAKE 1 TABLET (10 MG TOTAL) BY MOUTH TWICE A DAY AS NEEDED FOR ANXIETY     Ear, Nose, and Throat:  Antihistamines 2 Passed - 07/17/2023  9:27 AM      Passed - Cr in normal range and within 360 days    Creatinine, Ser  Date Value Ref Range Status  06/18/2023 0.79 0.57 - 1.00 mg/dL Final         Passed - Valid encounter within last 12 months    Recent Outpatient Visits           3 days ago Adult ADHD (attention deficit hyperactivity disorder)   [place] [facility]yn [facility], [facility]   1 month ago Adult ADHD (attention deficit hyperactivity disorder)   [place] [facility] Jack[facility]yn Confer, MD   1 month ago Encounter for annual physical exam   [place] [facility] Jackolyn Confer, MD   6 months ago Class 3 severe obesity due to excess calories with serious comorbidity and body mass index (BMI) of 50.0 to 59.9 in adult [facility])   [place] [facility] [place], Corrie Dandy T, NP   7 months ago Sinus congestion    [facility] [place], Dorie Rank, NP       Future Appointments             In 1 week Evelene Croon, Atilano Median, MD [facility], [facility]

## 2023-07-29 ENCOUNTER — Encounter: Payer: Self-pay | Admitting: Pediatrics

## 2023-07-29 ENCOUNTER — Telehealth: Payer: Medicaid Other | Admitting: Pediatrics

## 2023-07-29 DIAGNOSIS — F0630 Mood disorder due to known physiological condition, unspecified: Secondary | ICD-10-CM

## 2023-07-29 DIAGNOSIS — F909 Attention-deficit hyperactivity disorder, unspecified type: Secondary | ICD-10-CM | POA: Diagnosis not present

## 2023-07-29 DIAGNOSIS — F330 Major depressive disorder, recurrent, mild: Secondary | ICD-10-CM | POA: Diagnosis not present

## 2023-07-29 MED ORDER — DROSPIRENONE-ETHINYL ESTRADIOL 3-0.02 MG PO TABS
1.0000 | ORAL_TABLET | Freq: Every day | ORAL | 11 refills | Status: DC
Start: 2023-07-29 — End: 2023-08-25

## 2023-07-29 MED ORDER — ESCITALOPRAM OXALATE 10 MG PO TABS
10.0000 mg | ORAL_TABLET | Freq: Every day | ORAL | 11 refills | Status: DC
Start: 2023-07-29 — End: 2024-02-08

## 2023-07-29 NOTE — Progress Notes (Addendum)
Telehealth Visit  I connected with  [name] on 07/29/23 by a video enabled telemedicine application and verified that I am speaking with the correct person using two identifiers.   I discussed the limitations of evaluation and management by telemedicine. The patient expressed understanding and agreed to proceed.  Subjective:    Patient ID: [name], female    DOB: January 10, 1981, 42 y.o.   MRN: [id number]  HPI: ANNAH SLAPPEY is a 42 y.o. female  Chief Complaint  Patient presents with   ADHD   Discussed the use of AI scribe software for clinical note transcription with the patient, who gave verbal consent to proceed.  History of Present Illness   The patient is a 42 year old individual with a history of ADHD, anxiety, and depression. She recently transitioned from Wellbutrin to Concerta, which has been well-tolerated. The patient reports a significant increase in agitation and mood changes in the week leading up to her menstrual cycle, suggesting a possible diagnosis of premenstrual dysphoric disorder (PMDD).  The patient has a history of using hormonal birth control but switched to [facility], a non-hormonal intrauterine device, after having children. She reports an alternating menstrual cycle, with heavy flow every other month. The patient has expressed a willingness to try hormonal birth control again, in conjunction with Lexapro, to manage her symptoms.  The patient is also on Effexor, which she has been taking at the same dose for several years.  The patient has also been taking Concerta for ADHD. She reports an improvement in her ability to complete tasks, although she still experiences some difficulties with concentration.  In summary, the patient presents with a complex combination of ADHD, anxiety, depression, and possible PMDD. She is open to medication changes and is proactive in managing her health.      No SI/HI.   Relevant past medical, surgical,  family and social history reviewed and updated as indicated. Interim medical history since our last visit reviewed. Allergies and medications reviewed and updated.  ROS per HPI unless specifically indicated above     Objective:    There were no vitals taken for this visit.  Wt Readings from Last 3 Encounters:  06/18/23 285 lb 9.6 oz (129.5 kg)  06/11/23 289 lb (131.1 kg)  01/01/23 285 lb 4.8 oz (129.4 kg)     Physical Exam Constitutional:      General: She is not in acute distress.    Appearance: Normal appearance.  Neurological:     General: No focal deficit present.     Mental Status: She is alert. Mental status is at baseline.      LIMITED EXAM GIVEN VIDEO VISIT     Assessment & Plan:  Assessment & Plan   Assessment and Plan    Menstrual-related mood disorder Significant mood changes and physical symptoms associated with menstrual cycle. Discussed the possibility of PMDD and the benefits of hormonal regulation and SSRIs. -Start Lexapro 5mg  for one week, then increase to 10mg  daily. -Consider low-dose combined oral contraceptive. -Provide patient with PMDD symptom tracking calendar. -     Drospirenone-Ethinyl Estradiol; Take 1 tablet by mouth daily.  Dispense: 28 tablet; Refill: 11  Adult ADHD (attention deficit hyperactivity disorder) Improved concentration and task completion with Concerta. Noted some difficulty with sleep initiation. -Continue Concerta, monitor for absorption issues due to bariatric surgery history.  Mild episode of recurrent major depressive disorder (HCC) Currently on Effexor, plan to transition to Lexapro. No safety concerns at this time. -Reduce  Effexor to 75mg  daily for two weeks after starting Lexapro. -Discontinue Effexor after two weeks if tolerated. -     Escitalopram Oxalate; Take 1 tablet (10 mg total) by mouth daily. Take 1/2 tab for the first 5-7 days, then take full tab daily.  Dispense: 30 tablet; Refill: 11  General Health  Maintenance -Plan for flu shot at next visit on August 31, 2023.       Follow up plan: Return in about 1 month (around 08/31/2023) for Mood, ADHD and flu shot.  Petrea Fredenburg P Tylin Force, MD   This visit was completed via video visit through MyChart due to the restrictions of the COVID-19 pandemic. All issues as above were discussed and addressed. Physical exam was done as above through visual confirmation on video through MyChart. If it was felt that the patient should be evaluated in the office, they were directed there. The patient verbally consented to this visit."} Location of the patient: work Location of the provider: home Those involved with this call:  Provider: Modena Nunnery, MD CMA: Wilhemena Durie, CMA Time spent on call:  25 minutes with patient face to face via video conference. More than 50% of this time was spent in counseling and coordination of care. 5 minutes total spent in review of patient's record and preparation of their chart.

## 2023-07-29 NOTE — Progress Notes (Signed)
APPOINTMENT HAS BEEN SCHEDULED

## 2023-07-29 NOTE — Patient Instructions (Addendum)
Short summary:  First week: - start lexapro 1/2 5 mg, after 1 week you're going to take full tab 10mg . Most people take it at night because it makes them sleepy but feel free to take it in the morning if it doesn't make you sleepy and that works better with your schedule - start daily birth control, I sent yaz but the pharmacy may change it to the generic combined pill. Take this daily. The last week is a placebo week so some people don't take it. It doesn't harm anything if you take it though so if it helps you keep track of your periods feel free to take the placebo week.  Second week: - start taking only one of the 75mg  effexor pills for the next two weeks then stop - continue week one medications. This week you should be at 10mg  lexapro.  The PMDD calendar can be found here: https://www.[employer].com.ee.enlarge.html  There is another one here: https://www.center[facility]ness.com/wp-content/uploads/2020/01/PMDD-MOOD-CHART-122019.pdf  Feel free to google "PMDD chart pdf", there's a ton online for keeping track of symptoms.   Detailed summary of visit:  VISIT SUMMARY:  Today, we discussed your ongoing management of ADHD, anxiety, depression, and possible premenstrual dysphoric disorder (PMDD). We reviewed your current medications and made some adjustments to better address your symptoms.   INSTRUCTIONS:  Please follow the medication adjustments as discussed: start Lexapro 5mg  for one week, then increase to 10mg  daily. Reduce Effexor to 75mg  daily for two weeks after starting Lexapro, and discontinue Effexor if tolerated. Continue taking Concerta and monitor for any issues. Use the PMDD symptom tracking calendar to monitor your symptoms. Plan to receive your flu shot at the next visit on August 31, 2023.  YOUR PLAN:  -PREMENSTRUAL DYSPHORIC DISORDER (PMDD):  PMDD is a severe form of premenstrual syndrome (PMS) that includes emotional and physical symptoms. We will start you on Lexapro 5mg  for one week, then increase to 10mg  daily. Additionally, we will consider a low-dose combined oral contraceptive to help regulate your hormones. Please use the PMDD symptom tracking calendar provided to monitor your symptoms.  -ADHD: ADHD is a condition that affects focus and behavior. You have reported improved concentration with Concerta, so we will continue this medication. We will also monitor for any absorption issues due to your history of bariatric surgery.  -DEPRESSION/ANXIETY: Depression and anxiety are mood disorders that can affect your daily life. We will start transitioning you from Effexor to Lexapro. Reduce Effexor to 75mg  daily for two weeks after starting Lexapro, and then discontinue Effexor if you tolerate the change well.  -GENERAL HEALTH MAINTENANCE: We will plan for you to receive a flu shot at your next visit on August 31, 2023. Keeping up with vaccinations is important for your overall health.

## 2023-08-09 ENCOUNTER — Ambulatory Visit
Admission: RE | Admit: 2023-08-09 | Discharge: 2023-08-09 | Disposition: A | Discharge status: Home / Self Care | Payer: Medicaid Other | Source: Ambulatory Visit | Attending: Pediatrics | Admitting: Pediatrics

## 2023-08-09 DIAGNOSIS — Z1231 Encounter for screening mammogram for malignant neoplasm of breast: Secondary | ICD-10-CM | POA: Diagnosis not present

## 2023-08-12 DIAGNOSIS — Z1231 Encounter for screening mammogram for malignant neoplasm of breast: Secondary | ICD-10-CM | POA: Diagnosis not present

## 2023-08-18 ENCOUNTER — Other Ambulatory Visit: Payer: Self-pay | Admitting: Pediatrics

## 2023-08-18 DIAGNOSIS — F909 Attention-deficit hyperactivity disorder, unspecified type: Secondary | ICD-10-CM

## 2023-08-18 MED ORDER — METHYLPHENIDATE HCL ER (OSM) 18 MG PO TBCR: 18.0000 mg | EXTENDED_RELEASE_TABLET | Freq: Every day | ORAL | 0 refills | Status: DC

## 2023-08-18 NOTE — Progress Notes (Signed)
Sent 2 weeks refill to carry to 11/27 appointment.  Ethyl Vila Howell Pringle, MD

## 2023-08-25 ENCOUNTER — Ambulatory Visit: Payer: Medicaid Other | Admitting: Pediatrics

## 2023-08-25 VITALS — BP 133/82 | HR 70 | Ht 69.0 in | Wt 289.2 lb

## 2023-08-25 DIAGNOSIS — Z23 Encounter for immunization: Secondary | ICD-10-CM

## 2023-08-25 DIAGNOSIS — F909 Attention-deficit hyperactivity disorder, unspecified type: Secondary | ICD-10-CM

## 2023-08-25 DIAGNOSIS — G44209 Tension-type headache, unspecified, not intractable: Secondary | ICD-10-CM

## 2023-08-25 DIAGNOSIS — F330 Major depressive disorder, recurrent, mild: Secondary | ICD-10-CM | POA: Diagnosis not present

## 2023-08-25 DIAGNOSIS — R21 Rash and other nonspecific skin eruption: Secondary | ICD-10-CM | POA: Diagnosis not present

## 2023-08-25 DIAGNOSIS — N920 Excessive and frequent menstruation with regular cycle: Secondary | ICD-10-CM | POA: Diagnosis not present

## 2023-08-25 MED ORDER — METHYLPHENIDATE HCL ER (OSM) 27 MG PO TBCR: 27.0000 mg | EXTENDED_RELEASE_TABLET | Freq: Every day | ORAL | 0 refills | Status: DC

## 2023-08-25 MED ORDER — KETOCONAZOLE 2 % EX CREA: 1.0000 | TOPICAL_CREAM | Freq: Every day | CUTANEOUS | 0 refills | Status: DC

## 2023-08-25 NOTE — Assessment & Plan Note (Signed)
Patient reports some improvement on Concerta, but still experiencing some symptoms. -Increase Concerta to 27mg  daily. Follow up in 3-4 weeks to assess response.

## 2023-08-25 NOTE — Progress Notes (Signed)
Office Visit0  BP 133/82   Pulse 70   Ht 5\' 9"  (1.753 m)   Wt 289 lb 3.2 oz (131.2 kg)   LMP 08/02/2023   SpO2 98%   BMI 42.71 kg/m    Subjective:    Patient ID: Rachel Cooley, female    DOB: 21-May-1981, 42 y.o.   MRN: [id number]  HPI: Rachel Cooley is a 42 y.o. female  Chief Complaint  Patient presents with   Contraception    Patient says she bled for 12 days after starting the birth control prescribed at her last visit. Patient says she does get regular cycles while having the IUD. Patient says the additional bleeding was after a regular cycle. Patient says she has since stopped taking the medication.   Breast Problem    Patient says she has noticed a spot under her breast. Patient says they do not itch, but she says she thought it was wing worm or fungal. Patient says she recently started Lume all body deodorant. Patient says she has tried over the counter medications and old prescription of Nystatin cream.    Discussed the use of AI scribe software for clinical note transcription with the patient, who gave verbal consent to proceed.  History of Present Illness   The patient, with a history of ADHD and recent initiation of birth control, presents with concerns of prolonged menstrual bleeding and persistent headaches. The patient reports experiencing heavy bleeding for approximately two weeks, which she attributes to the recent initiation of birth control. The bleeding was so severe that the patient decided to stop taking the birth control a few days prior to the consultation. The patient also mentions experiencing a persistent headache for the past two days, which she rates as a 3-4 on a pain scale.  In addition to these primary concerns, the patient also reports a history of weight loss surgery and is currently on a regimen of ADHD medication, which she ran out of a few days prior to the consultation. The patient feels that the ADHD medication has been somewhat  beneficial, but still experiences difficulty with task completion and feels scattered.  The patient also mentions a skin issue, with patches appearing on the chest. She initially thought it was a fungal infection and tried over-the-counter treatments, but these did not seem to help. The patient has been using a new deodorant product, which she suspects might have caused the skin issue.  Lastly, the patient mentions that she has been weaning off Effexor, a medication she has been on for over two decades. She reports experiencing some unusual dreams and tingling sensations in her hands, which she attributes to the withdrawal from Effexor. The patient is also on Lexapro, which she feels has been beneficial.     Relevant past medical, surgical, family and social history reviewed and updated as indicated. Interim medical history since our last visit reviewed. Allergies and medications reviewed and updated.  ROS per HPI unless specifically indicated above     Objective:    BP 133/82   Pulse 70   Ht 5\' 9"  (1.753 m)   Wt 289 lb 3.2 oz (131.2 kg)   LMP 08/02/2023   SpO2 98%   BMI 42.71 kg/m   Wt Readings from Last 3 Encounters:  08/25/23 289 lb 3.2 oz (131.2 kg)  06/18/23 285 lb 9.6 oz (129.5 kg)  06/11/23 289 lb (131.1 kg)     Physical Exam Constitutional:      Appearance: Normal  appearance.  Pulmonary:     Effort: Pulmonary effort is normal.  Musculoskeletal:        General: Normal range of motion.  Skin:    Comments: 2 erythematous well circumscribed patches near breast, not on creases  Neurological:     General: No focal deficit present.     Mental Status: She is alert. Mental status is at baseline.  Psychiatric:        Mood and Affect: Mood normal.        Behavior: Behavior normal.        Thought Content: Thought content normal.         08/25/2023    8:17 AM 07/29/2023    8:00 AM 07/16/2023    2:12 PM 06/18/2023    8:16 AM 06/11/2023    4:13 PM  Depression screen PHQ  2/9  Decreased Interest 1 1 0 2 0  Down, Depressed, Hopeless 1 1 0 1 0  PHQ - 2 Score 2 2 0 3 0  Altered sleeping 1 1 0 3 3  Tired, decreased energy 1 1 0 2 2  Change in appetite 1 2 0 1 1  Feeling bad or failure about yourself  1 0 0 0 0  Trouble concentrating 2 0 0 2 2  Moving slowly or fidgety/restless 0 0 0 1 1  Suicidal thoughts 0 0 0 0 0  PHQ-9 Score 8 6 0 12 9  Difficult doing work/chores Somewhat difficult Not difficult at all Not difficult at all Somewhat difficult Somewhat difficult       08/25/2023    8:17 AM 07/29/2023    8:02 AM 07/16/2023    2:13 PM 06/18/2023    8:17 AM  GAD 7 : Generalized Anxiety Score  Nervous, Anxious, on Edge 1 1 2 1   Control/stop worrying 1 0 0 0  Worry too much - different things 1 1 0 0  Trouble relaxing 1 1 0 3  Restless 1 1 0 0  Easily annoyed or irritable 2 2 2 3   Afraid - awful might happen 0 0 0 0  Total GAD 7 Score 7 6 4 7   Anxiety Difficulty Somewhat difficult Somewhat difficult Somewhat difficult Somewhat difficult       Assessment & Plan:  Assessment & Plan   Adult ADHD (attention deficit hyperactivity disorder) Assessment & Plan: Patient reports some improvement on Concerta, but still experiencing some symptoms. -Increase Concerta to 27mg  daily. Follow up in 3-4 weeks to assess response.  Orders: -     Methylphenidate HCl ER (OSM); Take 1 tablet (27 mg total) by mouth daily.  Dispense: 30 tablet; Refill: 0  Mild episode of recurrent major depressive disorder [facility]) Assessment & Plan: Patient is weaning off Effexor and currently on Lexapro 10mg . Reports some tingling sensations, possibly related to Effexor withdrawal. -Continue current plan of weaning off Effexor.   Menorrhagia with regular cycle Extended bleeding while on Yaz birth control. Patient stopped taking it a few days ago. Discussed the possibility of body adjusting to the medication. -Resume Yaz for at least three more cycles to assess  response.  Rash Red patches on skin, appears more eczematous on exam but had prior concern for fungal infection. Over-the-counter antifungal treatment did not clear it up. -Prescribe Ketoconazole ointment. If no improvement in 2-3 days, switch to Triamcinolone cream (has at home). -     Ketoconazole; Apply 1 Application topically daily.  Dispense: 15 g; Refill: 0  Acute non intractable tension-type headache  Patient reports a persistent headache for two days. Possible causes include dehydration, ADHD medication, or withdrawal from Effexor. -Provided Nurtec for potential migraine relief.  Need for vaccination -     Flu vaccine trivalent PF, 6mos and older(Flulaval,Afluria,Fluarix,Fluzone)   Follow up plan: Return in about 4 weeks (around 09/22/2023) for ADHD.  Yalda Herd Howell Pringle, MD

## 2023-08-25 NOTE — Patient Instructions (Signed)
Start 27mg  daily concerta Ketoconozole ointment, if doesn't work use triamcinolone after 2-3 days

## 2023-08-25 NOTE — Assessment & Plan Note (Signed)
Patient is weaning off Effexor and currently on Lexapro 10mg . Reports some tingling sensations, possibly related to Effexor withdrawal. -Continue current plan of weaning off Effexor.

## 2023-08-31 ENCOUNTER — Ambulatory Visit: Payer: Medicaid Other | Admitting: Pediatrics

## 2023-09-01 DIAGNOSIS — K449 Diaphragmatic hernia without obstruction or gangrene: Secondary | ICD-10-CM | POA: Diagnosis not present

## 2023-09-01 DIAGNOSIS — K219 Gastro-esophageal reflux disease without esophagitis: Secondary | ICD-10-CM | POA: Diagnosis not present

## 2023-09-01 DIAGNOSIS — Z79899 Other long term (current) drug therapy: Secondary | ICD-10-CM | POA: Diagnosis not present

## 2023-09-01 DIAGNOSIS — F32A Depression, unspecified: Secondary | ICD-10-CM | POA: Diagnosis not present

## 2023-09-01 DIAGNOSIS — F419 Anxiety disorder, unspecified: Secondary | ICD-10-CM | POA: Diagnosis not present

## 2023-09-01 DIAGNOSIS — Z903 Acquired absence of stomach [part of]: Secondary | ICD-10-CM | POA: Diagnosis not present

## 2023-09-01 DIAGNOSIS — Z9884 Bariatric surgery status: Secondary | ICD-10-CM | POA: Diagnosis not present

## 2023-09-01 DIAGNOSIS — R12 Heartburn: Secondary | ICD-10-CM | POA: Diagnosis not present

## 2023-09-13 DIAGNOSIS — F909 Attention-deficit hyperactivity disorder, unspecified type: Secondary | ICD-10-CM

## 2023-09-14 MED ORDER — METHYLPHENIDATE HCL ER (OSM) 27 MG PO TBCR: 27.0000 mg | EXTENDED_RELEASE_TABLET | Freq: Every day | ORAL | 0 refills | Status: DC

## 2023-09-22 ENCOUNTER — Ambulatory Visit: Payer: Medicaid Other | Admitting: Pediatrics

## 2023-09-22 VITALS — BP 120/78 | HR 81 | Temp 98.1°F | Ht 69.0 in | Wt 293.0 lb

## 2023-09-22 DIAGNOSIS — F0630 Mood disorder due to known physiological condition, unspecified: Secondary | ICD-10-CM | POA: Diagnosis not present

## 2023-09-22 DIAGNOSIS — F909 Attention-deficit hyperactivity disorder, unspecified type: Secondary | ICD-10-CM | POA: Diagnosis not present

## 2023-09-22 DIAGNOSIS — K219 Gastro-esophageal reflux disease without esophagitis: Secondary | ICD-10-CM | POA: Diagnosis not present

## 2023-09-22 DIAGNOSIS — Z1330 Encounter for screening examination for mental health and behavioral disorders, unspecified: Secondary | ICD-10-CM | POA: Diagnosis not present

## 2023-09-22 DIAGNOSIS — F330 Major depressive disorder, recurrent, mild: Secondary | ICD-10-CM | POA: Diagnosis not present

## 2023-09-22 NOTE — Patient Instructions (Signed)
Stop birth control  Keep lexapro where it is  Continue concerta 27mg 

## 2023-09-22 NOTE — Progress Notes (Signed)
Office Visit  BP 120/78 (BP Location: Right Arm, Patient Position: Sitting)   Pulse 81   Temp 98.1 F (36.7 C) (Oral)   Ht 5\' 9"  (1.753 m)   Wt 293 lb (132.9 kg)   SpO2 99%   BMI 43.27 kg/m    Subjective:    Patient ID: Rachel Cooley, female    DOB: [birth year], 42 y.o.   MRN: [id number]  HPI: Rachel Cooley is a 42 y.o. female  Chief Complaint  Patient presents with   1 month follow up   ADHD    Only had medication for 1 week     Discussed the use of AI [id number] software for clinical note transcription with the patient, who gave verbal consent to proceed.  History of Present Illness   The patient, with a history of ADHD, PMDD, and a recent upper EGD revealing a hiatal hernia, presents with concerns about emotional instability and irritability. She reports a recent period of feeling "pretty good," but this has since declined, with the patient experiencing increased agitation, frustration, and emotional lability, including frequent crying. She also reports a loss of train of thought and a tendency towards impulsive spending, which she describes as therapeutic.  The patient has been on Lexapro for two months, which she reports has not significantly improved her emotional state. She has also been on Concerta, which she felt initially improved her condition, but she experienced a three-week period without it due to pharmacy issues, during which she noticed a return of irritability and agitation.  The patient also reports menstrual irregularities, with her last period lasting eight to nine days, and the one before that lasting twelve days. She has been on hormonal birth control for two months, but she is unsure if it has helped with her symptoms. She reports that her periods are typically heavier and longer than usual, and she often experiences increased emotional instability and irritability in the two weeks leading up to her period.  In addition to these concerns, the  patient has been dealing with a hiatal hernia, discovered during a recent upper EGD. She reports that she had a similar hernia repaired during a previous surgery, and she is unsure if this is a new hernia or a recurrence of the old one. She is scheduled to discuss this issue with her surgical team in the near future.      Weight trend: Wt Readings from Last 3 Encounters:  09/22/23 293 lb (132.9 kg)  08/25/23 289 lb 3.2 oz (131.2 kg)  06/18/23 285 lb 9.6 oz (129.5 kg)   Relevant past medical, surgical, family and social history reviewed and updated as indicated. Interim medical history since our last visit reviewed. Allergies and medications reviewed and updated.  ROS per HPI unless specifically indicated above     Objective:    BP 120/78 (BP Location: Right Arm, Patient Position: Sitting)   Pulse 81   Temp 98.1 F (36.7 C) (Oral)   Ht 5\' 9"  (1.753 m)   Wt 293 lb (132.9 kg)   SpO2 99%   BMI 43.27 kg/m   Wt Readings from Last 3 Encounters:  09/22/23 293 lb (132.9 kg)  08/25/23 289 lb 3.2 oz (131.2 kg)  06/18/23 285 lb 9.6 oz (129.5 kg)     Physical Exam Constitutional:      Appearance: Normal appearance.  Pulmonary:     Effort: Pulmonary effort is normal.  Musculoskeletal:        General: Normal range of  motion.  Skin:    Comments: Normal skin color  Neurological:     General: No focal deficit present.     Mental Status: She is alert. Mental status is at baseline.  Psychiatric:        Mood and Affect: Mood normal.        Behavior: Behavior normal.        Thought Content: Thought content normal.         09/22/2023    3:55 PM 08/25/2023    8:17 AM 07/29/2023    8:00 AM 07/16/2023    2:12 PM 06/18/2023    8:16 AM  Depression screen PHQ 2/9  Decreased Interest 1 1 1  0 2  Down, Depressed, Hopeless 2 1 1  0 1  PHQ - 2 Score 3 2 2  0 3  Altered sleeping 1 1 1  0 3  Tired, decreased energy 2 1 1  0 2  Change in appetite 2 1 2  0 1  Feeling bad or failure about  yourself  1 1 0 0 0  Trouble concentrating 2 2 0 0 2  Moving slowly or fidgety/restless 0 0 0 0 1  Suicidal thoughts 0 0 0 0 0  PHQ-9 Score 11 8 6  0 12  Difficult doing work/chores  Somewhat difficult Not difficult at all Not difficult at all Somewhat difficult       09/22/2023    3:54 PM 08/25/2023    8:17 AM 07/29/2023    8:02 AM 07/16/2023    2:13 PM  GAD 7 : Generalized Anxiety Score  Nervous, Anxious, on Edge 2 1 1 2   Control/stop worrying 0 1 0 0  Worry too much - different things 0 1 1 0  Trouble relaxing 0 1 1 0  Restless 0 1 1 0  Easily annoyed or irritable 3 2 2 2   Afraid - awful might happen 0 0 0 0  Total GAD 7 Score 5 7 6 4   Anxiety Difficulty  Somewhat difficult Somewhat difficult Somewhat difficult       Assessment & Plan:  Assessment & Plan   Mild episode of recurrent major depressive disorder (HCC) Menstrual-related mood disorder Assessment & Plan: Emotional lability and irritability, particularly in the two weeks leading up to menstruation. Hormonal birth control has not improved symptoms and has led to prolonged bleeding.   -Discontinue hormonal birth control.   -Track symptoms to identify suspected PMDD window.   -Continue lexapro 10mg , consider pulse increase during suspected PMDD window  Adult ADHD (attention deficit hyperactivity disorder) Assessment & Plan: Symptoms of inattention and forgetfulness have returned after a period without Concerta. Currently on 27mg  Concerta.   -Continue Concerta 27mg  daily.   -Consider increasing dose if symptoms persist.    Gastroesophageal reflux disease without esophagitis Assessment & Plan: Recent EGD revealed a hiatal hernia. Patient has been experiencing reflux symptoms.   -Await recommendations from weight loss nurse practitioner and gastroenterologist.   -Consider management options based on her recommendations.    Encounter for behavioral health screening As part of their intake evaluation, the patient  was screened for depression, anxiety.  PHQ9 SCORE 11, GAD7 SCORE 5. Screening results positive for tested conditions. See plan under problem/diagnosis above.   Follow up plan: Return in about 6 weeks (around 11/03/2023) for ADHD.  Rasheida Broden Howell Pringle, MD

## 2023-09-24 DIAGNOSIS — F0630 Mood disorder due to known physiological condition, unspecified: Secondary | ICD-10-CM | POA: Insufficient documentation

## 2023-09-24 NOTE — Assessment & Plan Note (Signed)
Recent EGD revealed a hiatal hernia. Patient has been experiencing reflux symptoms.   -Await recommendations from weight loss nurse practitioner and gastroenterologist.   -Consider management options based on her recommendations.

## 2023-09-24 NOTE — Assessment & Plan Note (Signed)
Symptoms of inattention and forgetfulness have returned after a period without Concerta. Currently on 18mg  Concerta.   -Continue Concerta 18mg  daily.   -Consider increasing dose if symptoms persist.

## 2023-09-24 NOTE — Assessment & Plan Note (Signed)
Emotional lability and irritability, particularly in the two weeks leading up to menstruation. Hormonal birth control has not improved symptoms and has led to prolonged bleeding.   -Discontinue hormonal birth control.   -Track symptoms to identify PMDD window.   -Increase Lexapro to 15mg  during PMDD window, then decrease back to 10mg  after.

## 2023-10-08 DIAGNOSIS — E559 Vitamin D deficiency, unspecified: Secondary | ICD-10-CM | POA: Diagnosis not present

## 2023-10-08 DIAGNOSIS — K449 Diaphragmatic hernia without obstruction or gangrene: Secondary | ICD-10-CM | POA: Diagnosis not present

## 2023-10-08 DIAGNOSIS — F411 Generalized anxiety disorder: Secondary | ICD-10-CM | POA: Diagnosis not present

## 2023-10-08 DIAGNOSIS — Z9884 Bariatric surgery status: Secondary | ICD-10-CM | POA: Diagnosis not present

## 2023-10-08 DIAGNOSIS — E88819 Insulin resistance, unspecified: Secondary | ICD-10-CM | POA: Diagnosis not present

## 2023-10-08 DIAGNOSIS — E785 Hyperlipidemia, unspecified: Secondary | ICD-10-CM | POA: Diagnosis not present

## 2023-10-08 DIAGNOSIS — K219 Gastro-esophageal reflux disease without esophagitis: Secondary | ICD-10-CM | POA: Diagnosis not present

## 2023-10-11 ENCOUNTER — Other Ambulatory Visit: Payer: Self-pay | Admitting: Family Medicine

## 2023-10-11 DIAGNOSIS — F909 Attention-deficit hyperactivity disorder, unspecified type: Secondary | ICD-10-CM

## 2023-10-11 NOTE — Telephone Encounter (Signed)
 Requested medication (s) are due for refill today - yes  Requested medication (s) are on the active medication list -yes  Future visit scheduled -yes  Last refill: 09/14/23 #30  Notes to clinic: non delegated Rx  Requested Prescriptions  Pending Prescriptions Disp Refills   METHYLPHENIDATE  27 MG PO CR tablet [Pharmacy Med Name: METHYLPHENIDATE  HCL ER (OSM) 27 MG] 30 tablet     Sig: TAKE 1 TABLET BY MOUTH ONCE DAILY     Not Delegated - Psychiatry:  Stimulants/ADHD Failed - 10/11/2023 12:37 PM      Failed - This refill cannot be delegated      Failed - Urine Drug Screen completed in last 360 days      Passed - Last BP in normal range    BP Readings from Last 1 Encounters:  09/22/23 120/78         Passed - Last Heart Rate in normal range    Pulse Readings from Last 1 Encounters:  09/22/23 81         Passed - Valid encounter within last 6 months    Recent Outpatient Visits           2 weeks ago Mild episode of recurrent major depressive disorder (HCC)   [place] [facility] Herold Hadassah SQUIBB, MD   1 month ago Adult ADHD (attention deficit hyperactivity disorder)   [place] [facility] Herold Hadassah SQUIBB, MD   2 months ago Menstrual-related mood disorder   [place] [facility] Herold Hadassah SQUIBB, MD   2 months ago Adult ADHD (attention deficit hyperactivity disorder)   [place] [facility] Herold Hadassah SQUIBB, MD   3 months ago Adult ADHD (attention deficit hyperactivity disorder)   [place] [facility] Herold Hadassah SQUIBB, MD       Future Appointments             In 3 weeks Herold, Hadassah SQUIBB, MD [place] [facility]               Requested Prescriptions  Pending Prescriptions Disp Refills   METHYLPHENIDATE  27 MG PO CR tablet [Pharmacy Med Name: METHYLPHENIDATE  HCL ER (OSM) 27 MG] 30 tablet     Sig: TAKE 1 TABLET BY MOUTH ONCE DAILY     Not Delegated - Psychiatry:   Stimulants/ADHD Failed - 10/11/2023 12:37 PM      Failed - This refill cannot be delegated      Failed - Urine Drug Screen completed in last 360 days      Passed - Last BP in normal range    BP Readings from Last 1 Encounters:  09/22/23 120/78         Passed - Last Heart Rate in normal range    Pulse Readings from Last 1 Encounters:  09/22/23 81         Passed - Valid encounter within last 6 months    Recent Outpatient Visits           2 weeks ago Mild episode of recurrent major depressive disorder (HCC)   [place] [facility] Herold Hadassah SQUIBB, MD   1 month ago Adult ADHD (attention deficit hyperactivity disorder)   [place] [facility] Herold Hadassah SQUIBB, MD   2 months ago Menstrual-related mood disorder   [place] [facility] Herold Hadassah SQUIBB, MD   2 months ago Adult ADHD (attention deficit hyperactivity disorder)     [place] [facility] Herold Hadassah SQUIBB, MD   3 months ago Adult ADHD (attention deficit hyperactivity disorder)   [place] [facility] Herold Hadassah SQUIBB, MD       Future Appointments             In 3 weeks Herold, Hadassah SQUIBB, MD [facility]

## 2023-10-13 ENCOUNTER — Other Ambulatory Visit: Payer: Self-pay | Admitting: Family Medicine

## 2023-10-13 DIAGNOSIS — F909 Attention-deficit hyperactivity disorder, unspecified type: Secondary | ICD-10-CM

## 2023-10-13 MED ORDER — METHYLPHENIDATE HCL ER (OSM) 27 MG PO TBCR: 27.0000 mg | EXTENDED_RELEASE_TABLET | Freq: Every day | ORAL | 0 refills | Status: DC

## 2023-11-02 ENCOUNTER — Encounter: Payer: Self-pay | Admitting: Pediatrics

## 2023-11-02 ENCOUNTER — Ambulatory Visit: Payer: Medicaid Other | Admitting: Pediatrics

## 2023-11-02 VITALS — BP 127/78 | HR 74 | Temp 98.7°F | Resp 16 | Wt 291.8 lb

## 2023-11-02 DIAGNOSIS — Z6841 Body Mass Index (BMI) 40.0 and over, adult: Secondary | ICD-10-CM | POA: Diagnosis not present

## 2023-11-02 DIAGNOSIS — F909 Attention-deficit hyperactivity disorder, unspecified type: Secondary | ICD-10-CM

## 2023-11-02 DIAGNOSIS — G4700 Insomnia, unspecified: Secondary | ICD-10-CM | POA: Diagnosis not present

## 2023-11-02 DIAGNOSIS — F330 Major depressive disorder, recurrent, mild: Secondary | ICD-10-CM

## 2023-11-02 DIAGNOSIS — M1711 Unilateral primary osteoarthritis, right knee: Secondary | ICD-10-CM

## 2023-11-02 DIAGNOSIS — F0630 Mood disorder due to known physiological condition, unspecified: Secondary | ICD-10-CM

## 2023-11-02 DIAGNOSIS — Z1330 Encounter for screening examination for mental health and behavioral disorders, unspecified: Secondary | ICD-10-CM | POA: Diagnosis not present

## 2023-11-02 MED ORDER — HYDROXYZINE HCL 10 MG PO TABS: 10.0000 mg | ORAL_TABLET | Freq: Every day | ORAL | 1 refills | Status: DC | PRN

## 2023-11-02 MED ORDER — ESCITALOPRAM OXALATE 20 MG PO TABS: ORAL_TABLET | ORAL | 3 refills | Status: DC

## 2023-11-02 MED ORDER — SEMAGLUTIDE-WEIGHT MANAGEMENT 0.25 MG/0.5ML ~~LOC~~ SOAJ: 0.2500 mg | SUBCUTANEOUS | 0 refills | Status: DC

## 2023-11-02 MED ORDER — METHYLPHENIDATE HCL ER (OSM) 27 MG PO TBCR: 27.0000 mg | EXTENDED_RELEASE_TABLET | Freq: Every day | ORAL | 0 refills | Status: DC

## 2023-11-02 MED ORDER — ESCITALOPRAM OXALATE 5 MG PO TABS: ORAL_TABLET | ORAL | 3 refills | Status: DC

## 2023-11-02 NOTE — Assessment & Plan Note (Signed)
Doing well with 27mg  concerta. No adverse effects. Sleeping well. PDMP reviewed. Sent 3 month refills.

## 2023-11-02 NOTE — Assessment & Plan Note (Signed)
Noted mood changes and increased depressive symptoms 7-10 days prior to menstrual cycle. Currently on Lexapro 20mg  during this period with noted improvement in mood. -Continue pulse dosing of Lexapro 20mg  7-10 days prior to menstrual cycle. -Consider maintaining Lexapro 20mg  daily if mood improvement is sustained.

## 2023-11-02 NOTE — Progress Notes (Signed)
Office Visit  BP 127/78 (BP Location: Left Arm, Patient Position: Sitting, Cuff Size: Large)   Pulse 74   Temp 98.7 F (37.1 C) (Oral)   Resp 16   Wt 291 lb 12.8 oz (132.4 kg)   LMP 10/09/2023 (Exact Date)   SpO2 99%   BMI 43.09 kg/m    Subjective:    Patient ID: Rachel Cooley, female    DOB: 1981-09-05, 43 y.o.   MRN: [id number]  HPI: Rachel Cooley is a 43 y.o. female  Chief Complaint  Patient presents with   ADHD    Feels aggitation is better. Depressed is increased ASRS completed    Discussed Rachel use of AI scribe software for clinical note transcription with Rachel patient, who gave verbal consent to proceed.  History of Present Illness   Rachel patient presents with mood changes and medication management.  She has been experiencing mood changes, feeling 'a little Ignatius Specking' and emotional, which she attributes to her menstrual cycle. She takes a daily dose of 27 mg of her medication, which has reduced her agitation and irritability. To manage these symptoms, she has been taking an extra dose of Lexapro (20 mg) for Rachel past three to four days.  She describes a pattern of mood changes occurring seven to ten days before her menstrual cycle. Prior to Rachel past week, she felt okay, but recently she has experienced feelings of being 'emotional, down, and depressed.' She takes her medication around 10 AM after breakfast and notes feeling better by Rachel end of Rachel day.  She lacks motivation, particularly for exercise, despite intending to be more active. She takes a multivitamin and has previously taken vitamin D and B12 supplements, but had to stop due to high levels.  She has a history of knee pain, attributed to arthritis, which worsens with weather changes. She has not had an injection for her knee pain in about nine months.      Relevant past medical, surgical, family and social history reviewed and updated as indicated. Interim medical history since our last visit  reviewed. Allergies and medications reviewed and updated.  ROS per HPI unless specifically indicated above     Objective:    BP 127/78 (BP Location: Left Arm, Patient Position: Sitting, Cuff Size: Large)   Pulse 74   Temp 98.7 F (37.1 C) (Oral)   Resp 16   Wt 291 lb 12.8 oz (132.4 kg)   LMP 10/09/2023 (Exact Date)   SpO2 99%   BMI 43.09 kg/m   Wt Readings from Last 3 Encounters:  11/02/23 291 lb 12.8 oz (132.4 kg)  09/22/23 293 lb (132.9 kg)  08/25/23 289 lb 3.2 oz (131.2 kg)    Physical Exam Constitutional:      Appearance: Normal appearance.  Pulmonary:     Effort: Pulmonary effort is normal.  Musculoskeletal:        General: Normal range of motion.  Skin:    Comments: Normal skin color  Neurological:     General: No focal deficit present.     Mental Status: She is alert. Mental status is at baseline.  Psychiatric:        Mood and Affect: Mood normal.        Behavior: Behavior normal.        Thought Content: Thought content normal.         11/02/2023    8:07 AM 09/22/2023    3:55 PM 08/25/2023    8:17 AM 07/29/2023  8:00 AM 07/16/2023    2:12 PM  Depression screen PHQ 2/9  Decreased Interest 2 1 1 1  0  Down, Depressed, Hopeless 2 2 1 1  0  PHQ - 2 Score 4 3 2 2  0  Altered sleeping 1 1 1 1  0  Tired, decreased energy 2 2 1 1  0  Change in appetite 1 2 1 2  0  Feeling bad or failure about yourself  1 1 1  0 0  Trouble concentrating 1 2 2  0 0  Moving slowly or fidgety/restless 0 0 0 0 0  Suicidal thoughts 1 0 0 0 0  PHQ-9 Score 11 11 8 6  0  Difficult doing work/chores Somewhat difficult  Somewhat difficult Not difficult at all Not difficult at all       11/02/2023    8:07 AM 09/22/2023    3:54 PM 08/25/2023    8:17 AM 07/29/2023    8:02 AM  GAD 7 : Generalized Anxiety Score  Nervous, Anxious, on Edge 0 2 1 1   Control/stop worrying 0 0 1 0  Worry too much - different things 0 0 1 1  Trouble relaxing 0 0 1 1  Restless 0 0 1 1  Easily annoyed or  irritable 1 3 2 2   Afraid - awful might happen 0 0 0 0  Total GAD 7 Score 1 5 7 6   Anxiety Difficulty Not difficult at all  Somewhat difficult Somewhat difficult       Assessment & Plan:  Assessment & Plan   Adult ADHD (attention deficit hyperactivity disorder) Assessment & Plan: Doing well with 27mg  concerta. No adverse effects. Sleeping well. PDMP reviewed. Sent 3 month refills.  Orders: -     Methylphenidate HCl ER (OSM); Take 1 tablet (27 mg total) by mouth daily.  Dispense: 30 tablet; Refill: 0 -     Methylphenidate HCl ER (OSM); Take 1 tablet (27 mg total) by mouth daily.  Dispense: 30 tablet; Refill: 0 -     Methylphenidate HCl ER (OSM); Take 1 tablet (27 mg total) by mouth daily.  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 Currently on Lexapro 20mg  during this period with noted improvement in mood. -Continue pulse dosing of Lexapro 20mg  7-10 days prior to menstrual cycle. -Consider maintaining Lexapro 20mg  daily if mood improvement is sustained.  Orders: -     Escitalopram Oxalate; Take 20mg  during PMDD window (typically between 7-10 days before your period)  Dispense: 30 tablet; Refill: 3 -     Escitalopram Oxalate; [place] to titrate to 15mg  with Rachel 10mg  tab.  Dispense: 90 tablet; Refill: 3  BMI 40.0-44.9, adult (HCC) Osteoarthritis of right knee, unspecified osteoarthritis type Assessment & Plan: Discussed potential use of semaglutide for weight management. Patient is open to trying this medication. -Initiate approval process for semaglutide. Has upcoming visit with weight management clinic to discuss pharmacologic options as well. Will start approval process today. Noted difficulty w exercise from chronic knee osteoarthritis. -If approved, start at lowest dose and titrate slowly to minimize  side effects. Anticipate will improve with weight management. -Provide dosing schedule via MyChart.  Insomnia, unspecified type Using hydroxyzine as needed. -Refill hydroxyzine prescription.  -     hydrOXYzine HCl; Take 1 tablet (10 mg total) by mouth daily as needed. TAKE 1 TABLET (10 MG TOTAL) BY MOUTH TWICE A DAY  AS NEEDED FOR ANXIETY  Dispense: 180 tablet; Refill: 1  Encounter for behavioral health screening As part of their intake evaluation, Rachel patient was screened for depression, anxiety.  PHQ9 SCORE 11, GAD7 SCORE 1. Screening results positive for tested conditions. See plan under problem/diagnosis above.  Clinical coverage for weight loss GLP's  Medication being dispensed is Wegovy 3 mL/28 day. Titration doses are 2 mL/28 days.   [x]  Product being prescribed is FDA approved for Rachel indication, age, weight (if applicable) and not does not exceed dosing limits per Rachel Prescribing Information per Rachel clinical conditions for use.  [x]  Patient's baseline weight measured within Rachel last 45 days as required by provider before dispensing.  [x]  Patient is  s/p bariatric surgery regained weight otherwise new to pharmacologic therapy  and One of Rachel following:  [x]  Rachel beneficiary is 41 years of age or over and has ONE of Rachel following:  [x]  A BMI greater than or equal to 30 kg/m2  [x]  A BMI greater than or equal to 27 kg/m2 with at least one weight-related comorbidity/risk factor/complication (i.e. hypertension, type 2 diabetes, obstructive sleep apnea, cardiovascular disease, dyslipidemia)   If patient has one weight-related comorbidity/risk factor/complication (i.e. hypertension, type 2 diabetes, obstructive sleep apnea, cardiovascular disease, dyslipidemia), please list OSA, knee osteoarthritis, dyslipidemia.  []  Rachel beneficiary is 5 years of age or older with a BMI greater than or equal to 27 kg/m2 AND has established cardiovascular disease (CVD) defined as having a history of  myocardial infarction, stroke, or symptomatic peripheral disease, to be documented on the PA form. AND  [x]  Rachel beneficiary is currently on and will continue lifestyle modification including structured nutrition and physical activity, unless physical activity is not clinically appropriate at Rachel time GLP1 therapy commences AND  [x]  Rachel beneficiary will NOT be using Rachel requested agent in combination with another GLP-1 receptor agonist agent AND  [x]  Rachel beneficiary does NOT have any FDA-labeled contraindications to Rachel requested agent, including pregnancy, lactation, history of medullary thyroid cancer or multiple endocrine neoplasia type II.  Last weight recorded  Wt Readings from Last 3 Encounters:  11/02/23 291 lb 12.8 oz (132.4 kg)  09/22/23 293 lb (132.9 kg)  08/25/23 289 lb 3.2 oz (131.2 kg)   Last BMI recorded Body mass index is 43.09 kg/m.   Follow up plan: Return for pending wegovy approval.  Lyden Redner Howell Pringle, MD

## 2023-11-02 NOTE — Patient Instructions (Addendum)
Pulse dosing lexapro: - When you are in your "PMDD window" ok to go up to 20mg . I will sent 10mg  and 5mg  tabs for the other weeks to stay at 15mg  or 10mg  depending on your symptoms  Will start process for wegovy and message you once I have an approval with the dosing schedule

## 2023-11-02 NOTE — Assessment & Plan Note (Addendum)
Discussed potential use of semaglutide for weight management. Patient is open to trying this medication. -Initiate approval process for semaglutide. Has upcoming visit with weight management clinic to discuss pharmacologic options as well. Will start approval process today. Noted difficulty w exercise from chronic knee osteoarthritis. -If approved, start at lowest dose and titrate slowly to minimize side effects. -Provide dosing schedule via MyChart.

## 2024-01-29 ENCOUNTER — Other Ambulatory Visit: Payer: Self-pay | Admitting: Nurse Practitioner

## 2024-01-31 NOTE — Telephone Encounter (Signed)
 Requested Prescriptions  Pending Prescriptions Disp Refills   valACYclovir  (VALTREX ) 1000 MG tablet [Pharmacy Med Name: VALACYCLOVIR  HCL 1 GM TAB] 90 tablet 0    Sig: TAKE 1 TABLET BY MOUTH ONCE DAILY     Antimicrobials:  Antiviral Agents - Anti-Herpetic Failed - 01/31/2024  3:32 PM      Failed - Valid encounter within last 12 months    Recent Outpatient Visits   None

## 2024-02-01 ENCOUNTER — Other Ambulatory Visit: Payer: Self-pay | Admitting: Pediatrics

## 2024-02-01 DIAGNOSIS — F909 Attention-deficit hyperactivity disorder, unspecified type: Secondary | ICD-10-CM

## 2024-02-03 ENCOUNTER — Other Ambulatory Visit: Payer: Self-pay | Admitting: Pediatrics

## 2024-02-03 DIAGNOSIS — F909 Attention-deficit hyperactivity disorder, unspecified type: Secondary | ICD-10-CM

## 2024-02-03 MED ORDER — METHYLPHENIDATE HCL ER (OSM) 27 MG PO TBCR: 27.0000 mg | EXTENDED_RELEASE_TABLET | Freq: Every day | ORAL | 0 refills | Status: DC

## 2024-02-03 NOTE — Progress Notes (Signed)
 Sent 30 day fill of concerta . Chronic med. Seeing for follow up on 02/08/24.  Rachel Letters, MD

## 2024-02-04 NOTE — Telephone Encounter (Signed)
 Requested medication (s) are due for refill today: -  Requested medication (s) are on the active medication list: -  Last refill:    Future visit scheduled:   Notes to clinic:  dc'd 02/03/24 Change treatment (NT not delegated to refuse this type of med)   Requested Prescriptions  Pending Prescriptions Disp Refills   METHYLPHENIDATE  27 MG PO CR tablet [Pharmacy Med Name: METHYLPHENIDATE  HCL ER (OSM) 27 MG] 30 tablet     Sig: TAKE 1 TABLET BY MOUTH ONCE DAILY     There is no refill protocol information for this order

## 2024-02-08 ENCOUNTER — Encounter: Payer: Self-pay | Admitting: Pediatrics

## 2024-02-08 ENCOUNTER — Ambulatory Visit: Admitting: Pediatrics

## 2024-02-08 VITALS — BP 117/70 | HR 84 | Temp 97.9°F | Wt 299.2 lb

## 2024-02-08 DIAGNOSIS — F0630 Mood disorder due to known physiological condition, unspecified: Secondary | ICD-10-CM

## 2024-02-08 DIAGNOSIS — F909 Attention-deficit hyperactivity disorder, unspecified type: Secondary | ICD-10-CM

## 2024-02-08 DIAGNOSIS — M1712 Unilateral primary osteoarthritis, left knee: Secondary | ICD-10-CM

## 2024-02-08 DIAGNOSIS — F330 Major depressive disorder, recurrent, mild: Secondary | ICD-10-CM

## 2024-02-08 DIAGNOSIS — Z6841 Body Mass Index (BMI) 40.0 and over, adult: Secondary | ICD-10-CM | POA: Diagnosis not present

## 2024-02-08 MED ORDER — MELOXICAM 7.5 MG PO TABS
7.5000 mg | ORAL_TABLET | Freq: Every day | ORAL | 0 refills | Status: DC
Start: 2024-02-08 — End: 2024-03-01

## 2024-02-08 MED ORDER — DEXMETHYLPHENIDATE HCL 5 MG PO TABS: 5.0000 mg | ORAL_TABLET | Freq: Every day | ORAL | 0 refills | Status: DC | PRN

## 2024-02-08 MED ORDER — ESCITALOPRAM OXALATE 20 MG PO TABS: 20.0000 mg | ORAL_TABLET | Freq: Every day | ORAL | 3 refills | Status: DC

## 2024-02-08 NOTE — Progress Notes (Unsigned)
   Office Visit  BP 117/70   Pulse 84   Temp 97.9 F (36.6 C) (Oral)   Wt 299 lb 3.2 oz (135.7 kg)   LMP 02/06/2024   SpO2 98%   BMI 44.18 kg/m    Subjective:    Patient ID: Dicie Foster, female    DOB: 03-Jun-1981, 43 y.o.   MRN: [id number]  HPI: SOLVEIG MAIR is a 43 y.o. female  Chief Complaint  Patient presents with  . ADHD    Discussed the use of AI scribe software for clinical note transcription with the patient, who gave verbal consent to proceed.  History of Present Illness   Wt Readings from Last 3 Encounters:  02/08/24 299 lb 3.2 oz (135.7 kg)  11/02/23 291 lb 12.8 oz (132.4 kg)  09/22/23 293 lb (132.9 kg)     Relevant past medical, surgical, family and social history reviewed and updated as indicated. Interim medical history since our last visit reviewed. Allergies and medications reviewed and updated.  ROS per HPI unless specifically indicated above     Objective:    BP 117/70   Pulse 84   Temp 97.9 F (36.6 C) (Oral)   Wt 299 lb 3.2 oz (135.7 kg)   LMP 02/06/2024   SpO2 98%   BMI 44.18 kg/m   Wt Readings from Last 3 Encounters:  02/08/24 299 lb 3.2 oz (135.7 kg)  11/02/23 291 lb 12.8 oz (132.4 kg)  09/22/23 293 lb (132.9 kg)     Physical Exam      11/02/2023    8:07 AM 09/22/2023    3:55 PM 08/25/2023    8:17 AM 07/29/2023    8:00 AM 07/16/2023    2:12 PM  Depression screen PHQ 2/9  Decreased Interest 2 1 1 1  0  Down, Depressed, Hopeless 2 2 1 1  0  PHQ - 2 Score 4 3 2 2  0  Altered sleeping 1 1 1 1  0  Tired, decreased energy 2 2 1 1  0  Change in appetite 1 2 1 2  0  Feeling bad or failure about yourself  1 1 1  0 0  Trouble concentrating 1 2 2  0 0  Moving slowly or fidgety/restless 0 0 0 0 0  Suicidal thoughts 1 0 0 0 0  PHQ-9 Score 11 11 8 6  0  Difficult doing work/chores Somewhat difficult  Somewhat difficult Not difficult at all Not difficult at all       11/02/2023    8:07 AM 09/22/2023    3:54 PM 08/25/2023     8:17 AM 07/29/2023    8:02 AM  GAD 7 : Generalized Anxiety Score  Nervous, Anxious, on Edge 0 2 1 1   Control/stop worrying 0 0 1 0  Worry too much - different things 0 0 1 1  Trouble relaxing 0 0 1 1  Restless 0 0 1 1  Easily annoyed or irritable 1 3 2 2   Afraid - awful might happen 0 0 0 0  Total GAD 7 Score 1 5 7 6   Anxiety Difficulty Not difficult at all  Somewhat difficult Somewhat difficult       Assessment & Plan:  Assessment & Plan   There are no diagnoses linked to this encounter.   Assessment and Plan Assessment & Plan      Follow up plan: No follow-ups on file.  Hadassah Letters, MD

## 2024-02-08 NOTE — Telephone Encounter (Signed)
 Appt scheduled 02/08/24

## 2024-02-08 NOTE — Patient Instructions (Addendum)
 Increase lexapro  to 20mg  daily  I am sending labs, if normal we can discuss the transvaginal ultrasound  Continue concerta  27mg   Sent focalin 5mg  daily as needed (this is the short acting)  I am sending wegovy  again

## 2024-02-09 LAB — THYROID PANEL WITH TSH
Free Thyroxine Index: 1.6 (ref 1.2–4.9)
T3 Uptake Ratio: 26 % (ref 24–39)
T4, Total: 6.3 ug/dL (ref 4.5–12.0)
TSH: 1.73 u[IU]/mL (ref 0.450–4.500)

## 2024-02-09 LAB — FSH/LH
FSH: 5.7 m[IU]/mL
LH: 5.7 m[IU]/mL

## 2024-02-09 LAB — PROLACTIN: Prolactin: 5.6 ng/mL (ref 4.8–33.4)

## 2024-02-10 ENCOUNTER — Encounter: Payer: Self-pay | Admitting: Pediatrics

## 2024-02-10 NOTE — Assessment & Plan Note (Signed)
 Concerta  27mg  working well, given short acting focalin to try for breakthrough symptoms some of the days. Suspect may help around PMDD sx as well.

## 2024-02-10 NOTE — Assessment & Plan Note (Signed)
 Intermittent Lexapro  use. Considering dosage increase for mood stabilization. - Increase Lexapro  to 20 mg daily.

## 2024-02-10 NOTE — Assessment & Plan Note (Signed)
 Wegovy  previously sent. PA unclear status. Will follow up on this as should be covered.

## 2024-02-10 NOTE — Assessment & Plan Note (Signed)
 Left knee pain exacerbated by activity. History of arthritis and injections in right knee. Considering meloxicam  and potential knee injections. - Prescribe meloxicam  7.5 mg short-term. - Consider x-ray if pain persists or worsens in two weeks. - Discuss potential knee injections if needed.

## 2024-02-10 NOTE — Assessment & Plan Note (Addendum)
 Mood symptoms around menstrual cycle. Non-hormonal IUD chosen in the past due to smoking history. Considering Lexapro  increase. Discussed Mirena IUD benefits for PMDD as some pts due report benefits from this. Periods every three weeks.  Will send below. Gynecology consult considered. - Order labs: FSH, LH, prolactin, thyroid  levels. - Consider gynecology consult or uterine ultrasound if labs normal. - Increase Lexapro  to 20 mg daily. - Prescribe Focalin 5 mg as needed during PMDD window or later in the day as her mood/ADHD symptoms seem to be exacerbated at that time.

## 2024-02-16 DIAGNOSIS — K449 Diaphragmatic hernia without obstruction or gangrene: Secondary | ICD-10-CM | POA: Diagnosis not present

## 2024-02-16 DIAGNOSIS — Z9884 Bariatric surgery status: Secondary | ICD-10-CM | POA: Diagnosis not present

## 2024-02-16 DIAGNOSIS — K219 Gastro-esophageal reflux disease without esophagitis: Secondary | ICD-10-CM | POA: Diagnosis not present

## 2024-02-16 DIAGNOSIS — E66813 Obesity, class 3: Secondary | ICD-10-CM | POA: Diagnosis not present

## 2024-02-16 DIAGNOSIS — E785 Hyperlipidemia, unspecified: Secondary | ICD-10-CM | POA: Diagnosis not present

## 2024-02-16 DIAGNOSIS — E88819 Insulin resistance, unspecified: Secondary | ICD-10-CM | POA: Diagnosis not present

## 2024-02-16 DIAGNOSIS — E559 Vitamin D deficiency, unspecified: Secondary | ICD-10-CM | POA: Diagnosis not present

## 2024-02-16 DIAGNOSIS — F411 Generalized anxiety disorder: Secondary | ICD-10-CM | POA: Diagnosis not present

## 2024-02-16 DIAGNOSIS — E782 Mixed hyperlipidemia: Secondary | ICD-10-CM | POA: Diagnosis not present

## 2024-02-17 ENCOUNTER — Encounter: Payer: Self-pay | Admitting: Pediatrics

## 2024-02-17 NOTE — Progress Notes (Signed)
 Clinical coverage for weight loss GLP's   Medication being dispensed is Wegovy  3 mL/28 day. Titration doses are 2 mL/28 days.   [x]  Product being prescribed is FDA approved for the indication, age, weight (if applicable) and not does not exceed dosing limits per the Prescribing Information per the clinical conditions for use.  [x]  Patient's baseline weight measured within the last 45 days as required by provider before dispensing.  [x]  Patient is new to therapy and One of the following:   [x]  The beneficiary is 41 years of age or over and has ONE of the following:  [x]  A BMI greater than or equal to 30 kg/m2  [x]  A BMI greater than or equal to 27 kg/m2 with at least one weight-related comorbidity/risk factor/complication (i.e. hypertension, type 2 diabetes, obstructive sleep apnea, cardiovascular disease, dyslipidemia)  If patient has one weight-related comorbidity/risk factor/complication (i.e. hypertension, type 2 diabetes, obstructive sleep apnea, cardiovascular disease, dyslipidemia), please list. Patient suffers from weight-related comorbidity/risk factor/complication obstructive sleep apnea and hyperlipidemia.    Last BMI/Weight/Height recorded Estimated body mass index is 44.18 kg/m as calculated from the following:   Height as of 02/16/2024: 5\' 9"  (1.753 m).   Weight as of 02/16/2024 134.6 kg (296 lb 12.8 oz)  Body mass index is 43.83 kg/m.

## 2024-02-17 NOTE — Telephone Encounter (Signed)
 PA submitted currently waiting to hear back

## 2024-02-22 NOTE — Telephone Encounter (Signed)
 Approved, message sent to patient in another thread.

## 2024-02-29 ENCOUNTER — Ambulatory Visit: Admitting: Pediatrics

## 2024-03-01 ENCOUNTER — Encounter: Payer: Self-pay | Admitting: Pediatrics

## 2024-03-01 ENCOUNTER — Ambulatory Visit: Admitting: Pediatrics

## 2024-03-01 VITALS — BP 117/82 | HR 59 | Temp 97.6°F | Wt 294.4 lb

## 2024-03-01 DIAGNOSIS — G4700 Insomnia, unspecified: Secondary | ICD-10-CM

## 2024-03-01 DIAGNOSIS — F0630 Mood disorder due to known physiological condition, unspecified: Secondary | ICD-10-CM | POA: Diagnosis not present

## 2024-03-01 DIAGNOSIS — M1711 Unilateral primary osteoarthritis, right knee: Secondary | ICD-10-CM

## 2024-03-01 DIAGNOSIS — N920 Excessive and frequent menstruation with regular cycle: Secondary | ICD-10-CM | POA: Diagnosis not present

## 2024-03-01 DIAGNOSIS — F909 Attention-deficit hyperactivity disorder, unspecified type: Secondary | ICD-10-CM

## 2024-03-01 DIAGNOSIS — Z6841 Body Mass Index (BMI) 40.0 and over, adult: Secondary | ICD-10-CM

## 2024-03-01 DIAGNOSIS — F330 Major depressive disorder, recurrent, mild: Secondary | ICD-10-CM | POA: Diagnosis not present

## 2024-03-01 MED ORDER — WEGOVY 0.5 MG/0.5ML ~~LOC~~ SOAJ: 0.5000 mg | SUBCUTANEOUS | 0 refills | Status: DC

## 2024-03-01 MED ORDER — HYDROXYZINE HCL 25 MG PO TABS: 25.0000 mg | ORAL_TABLET | Freq: Every day | ORAL | 3 refills | Status: DC | PRN

## 2024-03-01 MED ORDER — METHYLPHENIDATE HCL ER (OSM) 27 MG PO TBCR: 27.0000 mg | EXTENDED_RELEASE_TABLET | Freq: Every day | ORAL | 0 refills | Status: DC | PRN

## 2024-03-01 MED ORDER — WEGOVY 1 MG/0.5ML ~~LOC~~ SOAJ: 1.0000 mg | SUBCUTANEOUS | 0 refills | Status: DC

## 2024-03-01 MED ORDER — ESCITALOPRAM OXALATE 20 MG PO TABS
20.0000 mg | ORAL_TABLET | Freq: Every day | ORAL | 3 refills | Status: AC
Start: 2024-03-01 — End: 2025-03-01

## 2024-03-01 MED ORDER — WEGOVY 1.7 MG/0.75ML ~~LOC~~ SOAJ: 1.7000 mg | SUBCUTANEOUS | 0 refills | Status: DC

## 2024-03-01 MED ORDER — DEXMETHYLPHENIDATE HCL 5 MG PO TABS: 5.0000 mg | ORAL_TABLET | Freq: Every day | ORAL | 0 refills | Status: DC | PRN

## 2024-03-01 NOTE — Progress Notes (Unsigned)
 Office Visit  BP 117/82   Pulse (!) 59   Temp 97.6 F (36.4 C) (Oral)   Wt 294 lb 6.4 oz (133.5 kg)   LMP 02/06/2024   SpO2 100%   BMI 43.48 kg/m    Subjective:    Patient ID: Rachel Cooley, female    DOB: 11-Oct-1980, 43 y.o.   MRN: [id number]  HPI: Rachel Cooley is a 43 y.o. female  Chief Complaint  Patient presents with   ADHD    Discussed the use of AI scribe software for clinical note transcription with the patient, who gave verbal consent to proceed.  History of Present Illness   Rachel Cooley is a 43 year old female who presents for follow-up on Wegovy  treatment and gynecological concerns.  She started Wegovy  a week ago and is due for her second injection tonight. She received only one box of the medication, which contains four shots, despite her insurance approving two months of treatment. She notes a decrease in appetite and feeling fuller faster, which she attributes to the medication. She has lost four pounds over the past week.  She reports an improvement in her mood and evening focus with an increased dose of Lexapro  and Focalin . She takes Lexapro  20 mg and Focalin  in the evenings. She also takes hydroxyzine  at night to help with sleep, but she wakes up in the middle of the night and then goes back to sleep.  She is experiencing premenstrual symptoms earlier than usual and reports occasional discomfort and pain during intercourse. She has a history of an IUD placement confirmed by ultrasound over a year ago. She expresses concern about potential gynecological issues.  Her daughter Rachel Cooley has ADHD and is on medication, while her other daughter Rachel Cooley has transient nystagmus. Rachel Cooley is doing better with treatment, and Rachel Cooley has learned to accommodate her condition.  She recently completed a short course of meloxicam  and reports feeling much better. She has not smoked since she was pregnant.     Relevant past medical, surgical, family and social  history reviewed and updated as indicated. Interim medical history since our last visit reviewed. Allergies and medications reviewed and updated.  ROS per HPI unless specifically indicated above     Objective:     BP 117/82   Pulse (!) 59   Temp 97.6 F (36.4 C) (Oral)   Wt 294 lb 6.4 oz (133.5 kg)   LMP 02/06/2024   SpO2 100%   BMI 43.48 kg/m   Wt Readings from Last 3 Encounters:  03/01/24 294 lb 6.4 oz (133.5 kg)  02/08/24 299 lb 3.2 oz (135.7 kg)  11/02/23 291 lb 12.8 oz (132.4 kg)     Physical Exam Constitutional:      Appearance: Normal appearance.  Pulmonary:     Effort: Pulmonary effort is normal.  Musculoskeletal:        General: Normal range of motion.  Skin:    Comments: Normal skin color  Neurological:     General: No focal deficit present.     Mental Status: She is alert. Mental status is at baseline.  Psychiatric:        Mood and Affect: Mood normal.        Behavior: Behavior normal.        Thought Content: Thought content normal.         03/01/2024    8:15 AM 02/08/2024    3:00 PM 11/02/2023    8:07 AM 09/22/2023  3:55 PM Rachel Cooley    8:17 AM  Depression screen PHQ 2/9  Decreased Interest 1 1 2 1 1   Down, Depressed, Hopeless 1 1 2 2 1   PHQ - 2 Score 2 2 4 3 2   Altered sleeping 2 2 1 1 1   Tired, decreased energy 1 1 2 2 1   Change in appetite 0 1 1 2 1   Feeling bad or failure about yourself  0 1 1 1 1   Trouble concentrating 1 1 1 2 2   Moving slowly or fidgety/restless 0 0 0 0 0  Suicidal thoughts 0 0 1 0 0  PHQ-9 Score 6 8 11 11 8   Difficult doing work/chores Not difficult at all Somewhat difficult Somewhat difficult  Somewhat difficult       03/01/2024    8:15 AM 02/08/2024    3:00 PM 11/02/2023    8:07 AM 09/22/2023    3:54 PM  GAD 7 : Generalized Anxiety Score  Nervous, Anxious, on Edge 1 1 0 2  Control/stop worrying 0 1 0 0  Worry too much - different things 0 1 0 0  Trouble relaxing 1 1 0 0  Restless 0 1 0 0  Easily annoyed or  irritable 1 2 1 3   Afraid - awful might happen 0 0 0 0  Total GAD 7 Score 3 7 1 5   Anxiety Difficulty Not difficult at all Somewhat difficult Not difficult at all        Assessment & Plan:  Assessment & Plan   Menorrhagia with regular cycle Assessment & Plan: Intermittent dyspareunia with menorrhagia. Ultrasound needed to assess IUD position. Discussed Mirena IUD as an alternative for PMDD and menstruation reduction. Addressed low clot risk with progesterone-only options. Has paraguard in place.  - Order pelvic ultrasound to assess IUD position and anatomy. - Consider gynecology referral if ultrasound findings are abnormal. - Discuss Mirena IUD as a potential alternative if removal is indicated.  Orders: -     US  PELVIC COMPLETE WITH TRANSVAGINAL; Future  Insomnia, unspecified type Assessment & Plan: Difficulty maintaining sleep. Lexapro  increase ineffective for sleep. Plan to increase hydroxyzine  dosage. - Increase hydroxyzine  to 25 mg at night for sleep.   Orders: -     hydrOXYzine  HCl; Take 1 tablet (25 mg total) by mouth daily as needed. TAKE 1 TABLET (10 MG TOTAL) BY MOUTH TWICE A DAY AS NEEDED FOR ANXIETY  Dispense: 90 tablet; Refill: 3  Adult ADHD (attention deficit hyperactivity disorder) Assessment & Plan: ADHD symptoms well-controlled with Concerta  and Focalin . Stable regimen. - Continue Concerta  27 mg daily as needed. - Continue Focalin  5 mg as needed.  Orders: -     Dexmethylphenidate  HCl; Take 1 tablet (5 mg total) by mouth daily as needed.  Dispense: 30 tablet; Refill: 0 -     Methylphenidate  HCl ER (OSM); Take 1 tablet (27 mg total) by mouth daily as needed.  Dispense: 30 tablet; Refill: 0  BMI 40.0-44.9, adult (HCC) Assessment & Plan: On Wegovy  with early satiety and 4-pound weight loss. Insurance approved two months of medication. Emphasized monitoring muscle mass and protein intake. Co managing with weight clinic. - Continue Wegovy  as  prescribed.  Orders: -     Wegovy ; Inject 0.5 mg into the skin once a week.  Dispense: 2 mL; Refill: 0 -     Wegovy ; Inject 1 mg into the skin once a week.  Dispense: 2 mL; Refill: 0 -     Wegovy ; Inject 1.7 mg into  the skin once a week.  Dispense: 3 mL; Refill: 0  Osteoarthritis of right knee, unspecified osteoarthritis type Assessment & Plan: Short course of meloxicam  completed. Symptoms resolved. Suspect ongoing improvement/prevention w wt management.   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 Improved mood, no significant side effects. - Continue Lexapro  20 mg daily. -     Escitalopram  Oxalate; Take 1 tablet (20 mg total) by mouth daily.  Dispense: 90 tablet; Refill: 3   Follow up plan: Return in about 3 months (around 06/01/2024) for ADHD.  Hadassah Letters, MD

## 2024-03-01 NOTE — Patient Instructions (Signed)
 Plan on ultrasound

## 2024-03-02 ENCOUNTER — Encounter: Payer: Self-pay | Admitting: Pediatrics

## 2024-03-02 DIAGNOSIS — G4700 Insomnia, unspecified: Secondary | ICD-10-CM | POA: Insufficient documentation

## 2024-03-02 DIAGNOSIS — N920 Excessive and frequent menstruation with regular cycle: Secondary | ICD-10-CM | POA: Insufficient documentation

## 2024-03-02 MED ORDER — METHYLPHENIDATE HCL ER (OSM) 27 MG PO TBCR: 27.0000 mg | EXTENDED_RELEASE_TABLET | ORAL | 0 refills | Status: DC

## 2024-03-02 MED ORDER — DEXMETHYLPHENIDATE HCL 5 MG PO TABS: 5.0000 mg | ORAL_TABLET | Freq: Every day | ORAL | 0 refills | Status: DC | PRN

## 2024-03-02 NOTE — Assessment & Plan Note (Signed)
 Depression well-managed with Lexapro  20 mg. Improved mood, no significant side effects. - Continue Lexapro  20 mg daily.

## 2024-03-02 NOTE — Assessment & Plan Note (Signed)
 ADHD symptoms well-controlled with Concerta  and Focalin . Stable regimen. - Continue Concerta  27 mg daily as needed. - Continue Focalin  5 mg as needed.

## 2024-03-02 NOTE — Assessment & Plan Note (Signed)
 Difficulty maintaining sleep. Lexapro  increase ineffective for sleep. Plan to increase hydroxyzine  dosage. - Increase hydroxyzine  to 25 mg at night for sleep.

## 2024-03-02 NOTE — Assessment & Plan Note (Signed)
 Intermittent dyspareunia with menorrhagia. Ultrasound needed to assess IUD position. Discussed Mirena IUD as an alternative for PMDD and menstruation reduction. Addressed low clot risk with progesterone-only options. Has paraguard in place.  - Order pelvic ultrasound to assess IUD position and anatomy. - Consider gynecology referral if ultrasound findings are abnormal. - Discuss Mirena IUD as a potential alternative if removal is indicated.

## 2024-03-02 NOTE — Assessment & Plan Note (Signed)
 Short course of meloxicam  completed. Symptoms resolved. Suspect ongoing improvement/prevention w wt management.

## 2024-03-02 NOTE — Assessment & Plan Note (Signed)
 On Wegovy  with early satiety and 4-pound weight loss. Insurance approved two months of medication. Emphasized monitoring muscle mass and protein intake. Co managing with weight clinic. - Continue Wegovy  as prescribed.

## 2024-03-13 ENCOUNTER — Ambulatory Visit
Admission: RE | Admit: 2024-03-13 | Discharge: 2024-03-13 | Disposition: A | Discharge status: Home / Self Care | Source: Ambulatory Visit | Attending: Pediatrics | Admitting: Pediatrics

## 2024-03-13 DIAGNOSIS — N920 Excessive and frequent menstruation with regular cycle: Secondary | ICD-10-CM | POA: Diagnosis not present

## 2024-03-15 ENCOUNTER — Ambulatory Visit: Payer: Self-pay | Admitting: Pediatrics

## 2024-03-15 DIAGNOSIS — Z6841 Body Mass Index (BMI) 40.0 and over, adult: Secondary | ICD-10-CM

## 2024-03-15 MED ORDER — WEGOVY 1 MG/0.5ML ~~LOC~~ SOAJ: 1.0000 mg | SUBCUTANEOUS | 0 refills | Status: DC

## 2024-03-28 ENCOUNTER — Other Ambulatory Visit: Payer: Self-pay | Admitting: Nurse Practitioner

## 2024-03-29 NOTE — Telephone Encounter (Signed)
 Rx 01/31/24 #90- too soon Requested Prescriptions  Pending Prescriptions Disp Refills   valACYclovir  (VALTREX ) 1000 MG tablet [Pharmacy Med Name: VALACYCLOVIR  HCL 1 GM TAB] 90 tablet 0    Sig: TAKE 1 TABLET BY MOUTH ONCE DAILY     Antimicrobials:  Antiviral Agents - Anti-Herpetic Failed - 03/29/2024  3:51 PM      Failed - Valid encounter within last 12 months    Recent Outpatient Visits           4 weeks ago Menorrhagia with regular cycle   [place] [facility] Herold Hadassah SQUIBB, MD   1 month ago BMI 40.0-44.9, adult [facility])   [facility] Herold Hadassah SQUIBB, MD

## 2024-03-31 ENCOUNTER — Other Ambulatory Visit: Payer: Self-pay | Admitting: Pediatrics

## 2024-03-31 DIAGNOSIS — F418 Other specified anxiety disorders: Secondary | ICD-10-CM

## 2024-03-31 MED ORDER — DIAZEPAM 5 MG PO TABS: ORAL_TABLET | ORAL | 0 refills | Status: DC

## 2024-03-31 NOTE — Progress Notes (Signed)
 Sending 2 pills of valium ahead of IUD insertion  Rachel SHAUNNA Nett, MD

## 2024-04-03 ENCOUNTER — Ambulatory Visit: Admitting: Pediatrics

## 2024-04-03 ENCOUNTER — Encounter: Payer: Self-pay | Admitting: Pediatrics

## 2024-04-03 DIAGNOSIS — Z30433 Encounter for removal and reinsertion of intrauterine contraceptive device: Secondary | ICD-10-CM

## 2024-04-03 DIAGNOSIS — Z975 Presence of (intrauterine) contraceptive device: Secondary | ICD-10-CM

## 2024-04-03 LAB — PREGNANCY, URINE: Preg Test, Ur: NEGATIVE

## 2024-04-03 NOTE — Patient Instructions (Signed)
 [facility]    IUD Take-Home Sheet  [ x   ] Hormonal IUD (Mirena, Liletta) It begins working now to prevent pregnancy. It can stay inside you for 8 years. Removal date __6/30/2033___(8 years from today)  Today you may go back to school or work after your visit. Wait 24 hours after your IUD is put in before you can use tampons, take a bath, or have vaginal sex.  You may have more cramps or heavier bleeding with your periods, or spotting between your periods. This is normal. The cramping and bleeding can last for 3-6 months with the Mirena, Liletta, Kyleena, and Skyla (hormone) IUDs. After 6 months, the cramping and bleeding should get better. Many people will stop having periods after 1 or 2 years with the Mirena, Liletta, Kyleena, and Skyla (hormone) IUDs. If you have the Paragard (copper) IUD, you may have more cramping and more bleeding with your periods as long as you have the IUD inside you.  Ibuprofen (also called Advil or Motrin) helps decrease the bleeding and cramping. You can buy Ibuprofen at any drug store without a prescription. You can take as many as 4 pills (800 mg) of Ibuprofen every 8 hours with food (each pill contains 200 mg). To prevent cramping, start taking Ibuprofen as soon as your period starts and keep taking it every 8 hours for the first 2-3 days of your period. You can also put a hot water bottle on your belly if you have bad cramps.   Your IUD may come out by itself in the first three months. If you can feel the strings, the IUD is in the right place. If your IUD comes out, you can become pregnant immediately. If you are not sure how to check the strings, we can help you (Our phone number is at the top of this paper.) Meanwhile, use condoms.   The IUD does NOT protect you against sexually transmitted infections. ALWAYS use protection against sexually transmitted infections (external condoms, internal condoms, dental dams) if you are at risk. If  you think you have been exposed to an STI, discuss testing with your clinician. Most infections can be treated WITHOUT removing your IUD.  WARNING SIGNS: Within the first 3 weeks of the IUD insertion: - Fever (>101F) - Chills - Strong or sharp pain in your stomach or belly At Any Time (these are very rare): - Late period (for Paragard users) - Feeling pregnant (breast tenderness, nausea, vomiting) - Positive home pregnancy test    If you develop any of the above warning signs, you can call this clinic at [phone], or see your usual clinician.

## 2024-04-04 ENCOUNTER — Encounter: Payer: Self-pay | Admitting: Pediatrics

## 2024-04-04 ENCOUNTER — Ambulatory Visit: Payer: Self-pay | Admitting: Pediatrics

## 2024-04-04 DIAGNOSIS — Z975 Presence of (intrauterine) contraceptive device: Secondary | ICD-10-CM | POA: Insufficient documentation

## 2024-04-04 NOTE — Progress Notes (Signed)
 IUD Removal Procedure Note  Rachel Cooley is a 43 y.o. female presenting for IUD removal.  IUD type paraguard.   Indication: would like amenorrheic effect of mirena.   Preparation:  The risks, benefits, and alternatives to the procedure were discussed in detail and printed informed consent was obtained and scanned.  Procedure Details:  Speculum was placed and the IUD strings were not easily visualized. Patient required uterine hook and alligator forceps but eventually the strings were grasped with ring forceps and the IUD was removed intact. The patient tolerated the procedure well.  Complications: none.  IUD Insertion Procedure Note  Rachel Cooley is a 43 y.o. female presenting for IUD insertion.   Indication: contraception  Preparation:  Urine pregnancy test result: neg Pain management prior to visit: ibuprofen 600mg  Anxiety: valium 2.5mg  EMLA coated tampon for prior to procedure: yes  The risks, benefits, and alternatives to the procedure were discussed in detail and electronic informed consent was obtained.  Procedure Details:  Bimanual exam was performed and the position of the uterus was noted to be anteverted. Speculum was placed in vagina and adequate visualization of cervix was achieved. The cervix was cleaned with betadine x 3. Lidocaine  gel was applied to the lateral fornices, anterior lip, and cervical os. A single toothed tenaculum was not needed. An os finder was used as needed. An endometrial pipelle was passed into the uterine cavity and the uterus was sounded to 10 cm.   The intrauterine device was then inserted sterilely into the uterine cavity to the level of the upper uterine cavity and the introducer was removed.  The IUD strings were cut 2 cm from the external os of the cervix.  Vaginal ultrasound not recommended  Patient tolerated procedure well.  Complications: none  IUD Type: Mirena, Lot # [id number], Expiration date 05/2025.    Post-procedure: standard post procedure care instructions were given to the patient. Patient aware that removal of IUD is due in 8 years. Routine health maintenance exams recommended.  Patient to follow-up in as needed.   Hadassah SHAUNNA Nett, MD

## 2024-04-04 NOTE — Assessment & Plan Note (Signed)
 Mirena, Lot # [id number], Expiration date 05/2025. Placed 04/03/2024, due for removal 2033

## 2024-04-18 DIAGNOSIS — Z6841 Body Mass Index (BMI) 40.0 and over, adult: Secondary | ICD-10-CM | POA: Diagnosis not present

## 2024-04-18 DIAGNOSIS — Z8639 Personal history of other endocrine, nutritional and metabolic disease: Secondary | ICD-10-CM | POA: Diagnosis not present

## 2024-04-18 DIAGNOSIS — E66813 Obesity, class 3: Secondary | ICD-10-CM | POA: Diagnosis not present

## 2024-04-18 DIAGNOSIS — Z903 Acquired absence of stomach [part of]: Secondary | ICD-10-CM | POA: Diagnosis not present

## 2024-04-25 ENCOUNTER — Ambulatory Visit: Admitting: Pediatrics

## 2024-04-27 ENCOUNTER — Other Ambulatory Visit: Payer: Self-pay | Admitting: Nurse Practitioner

## 2024-04-28 NOTE — Telephone Encounter (Signed)
 Requested Prescriptions  Pending Prescriptions Disp Refills   valACYclovir  (VALTREX ) 1000 MG tablet [Pharmacy Med Name: VALACYCLOVIR  HCL 1 GM TAB] 90 tablet 2    Sig: TAKE 1 TABLET BY MOUTH ONCE DAILY     Antimicrobials:  Antiviral Agents - Anti-Herpetic Passed - 04/28/2024  2:00 PM      Passed - Valid encounter within last 12 months    Recent Outpatient Visits           3 weeks ago Encounter for IUD removal and reinsertion   [place] [facility] Herold Hadassah SQUIBB, MD   1 month ago Menorrhagia with regular cycle   [place] [facility] Herold Hadassah SQUIBB, MD   2 months ago BMI 40.0-44.9, adult [facility])    [facility] Herold Hadassah SQUIBB, MD

## 2024-05-17 ENCOUNTER — Other Ambulatory Visit: Payer: Self-pay

## 2024-05-17 DIAGNOSIS — F909 Attention-deficit hyperactivity disorder, unspecified type: Secondary | ICD-10-CM

## 2024-05-17 MED ORDER — DEXMETHYLPHENIDATE HCL 5 MG PO TABS: 5.0000 mg | ORAL_TABLET | Freq: Every day | ORAL | 0 refills | Status: DC | PRN

## 2024-05-17 MED ORDER — METHYLPHENIDATE HCL ER (OSM) 27 MG PO TBCR: 27.0000 mg | EXTENDED_RELEASE_TABLET | ORAL | 0 refills | Status: DC

## 2024-05-17 NOTE — Telephone Encounter (Signed)
 Ok for E2C2 to review.  Please advise patient that due to a scheduling conflict provider will not be available for her appointment on 05/23/2024 and has been rescheduled. Since she will need refills prior to her reschedule date we will send refills with start dates of 05/31/2024 so that she will not be without during that time. Her appointment is rescheduled for 06/08/2024 at 9:40 am but can be rescheduled within a 2 week window of that if needed. Just needs to be sure to have a follow up completed prior to when he next refill would be due June 30, 2024.

## 2024-05-23 ENCOUNTER — Ambulatory Visit: Admitting: Pediatrics

## 2024-06-08 ENCOUNTER — Ambulatory Visit: Admitting: Pediatrics

## 2024-06-08 ENCOUNTER — Encounter: Payer: Self-pay | Admitting: Pediatrics

## 2024-06-08 VITALS — BP 115/76 | HR 70 | Temp 99.3°F | Wt 279.0 lb

## 2024-06-08 DIAGNOSIS — Z6841 Body Mass Index (BMI) 40.0 and over, adult: Secondary | ICD-10-CM | POA: Diagnosis not present

## 2024-06-08 DIAGNOSIS — G4700 Insomnia, unspecified: Secondary | ICD-10-CM | POA: Diagnosis not present

## 2024-06-08 DIAGNOSIS — F909 Attention-deficit hyperactivity disorder, unspecified type: Secondary | ICD-10-CM | POA: Diagnosis not present

## 2024-06-08 MED ORDER — HYDROXYZINE HCL 25 MG PO TABS: 25.0000 mg | ORAL_TABLET | Freq: Two times a day (BID) | ORAL | 3 refills | Status: AC | PRN

## 2024-06-08 NOTE — Progress Notes (Signed)
 Office Visit  BP 115/76   Pulse 70   Temp 99.3 F (37.4 C) (Oral)   Wt 279 lb (126.6 kg)   SpO2 99%   BMI 41.20 kg/m    Subjective:    Patient ID: Rachel Cooley, female    DOB: 09/06/1981, 43 y.o.   MRN: [id number]  HPI: Rachel Cooley is a 43 y.o. female  No chief complaint on file.   Discussed the use of AI scribe software for clinical note transcription with the patient, who gave verbal consent to proceed.  History of Present Illness   Rachel Cooley is a 43 year old female who presents with concerns about her mental health and medication management.  She is experiencing significant stress due to relationship issues with her partner of nearly twenty years, leading to mixed emotions as she tries to keep her family together. She has reached out to [facility] for individual therapy and is awaiting an appointment. She finds weightlifting at the gym beneficial for her mental health.  She has difficulty sleeping and uses hydroxyzine  to aid sleep, though she is unsure of the dosage and has requested an extra supply due to increased use. Some nights, she experiences insomnia and intense stress, describing a feeling of wanting to 'karate chop somebody'.  She is currently taking Concerta , but her new insurance, [employer], is not covering it, resulting in a $65 monthly cost. She is unsure if this is due to not meeting her deductible. She is also on Wegovy  for weight management, taking a 1 mg dose for two to three months, and is awaiting insurance approval for further doses after taking her last shot.  Her daughter Rudy, a twin, has ADHD and is on medication, showing improvement after adjustments over the summer. Her other daughter, Con, is undergoing a psychological evaluation due to irritability, laziness, and dependency on her sister. Con has transient mastitis and has been in occupational therapy for two years for emotional regulation and  impulsivity.  Her daughters were dismissed from a [place] private school due to behavioral expectations and are now thriving in a more supportive educational environment. She is proactive in seeking appropriate support and therapy for her children.  She reports menstrual irregularities, with her last regular period on August 12th and current spotting. She also notes occasional lower back discomfort, which she suspects may be related to her menstrual cycle.        Relevant past medical, surgical, family and social history reviewed and updated as indicated. Interim medical history since our last visit reviewed. Allergies and medications reviewed and updated.  ROS per HPI unless specifically indicated above     Objective:    BP 115/76   Pulse 70   Temp 99.3 F (37.4 C) (Oral)   Wt 279 lb (126.6 kg)   SpO2 99%   BMI 41.20 kg/m   Wt Readings from Last 3 Encounters:  06/08/24 279 lb (126.6 kg)  03/01/24 294 lb 6.4 oz (133.5 kg)  02/08/24 299 lb 3.2 oz (135.7 kg)     Physical Exam Constitutional:      Appearance: Normal appearance.  Pulmonary:     Effort: Pulmonary effort is normal.  Musculoskeletal:        General: Normal range of motion.  Skin:    Comments: Normal skin color  Neurological:     General: No focal deficit present.     Mental Status: She is alert. Mental status is at baseline.  Psychiatric:  Mood and Affect: Mood normal.        Behavior: Behavior normal.        Thought Content: Thought content normal.         06/08/2024    8:47 AM 03/01/2024    8:15 AM 02/08/2024    3:00 PM 11/02/2023    8:07 AM 09/22/2023    3:55 PM  Depression screen PHQ 2/9  Decreased Interest 1 1 1 2 1   Down, Depressed, Hopeless 1 1 1 2 2   PHQ - 2 Score 2 2 2 4 3   Altered sleeping 1 2 2 1 1   Tired, decreased energy 1 1 1 2 2   Change in appetite 0 0 1 1 2   Feeling bad or failure about yourself  1 0 1 1 1   Trouble concentrating 0 1 1 1 2   Moving slowly or fidgety/restless  0 0 0 0 0  Suicidal thoughts 0 0 0 1 0  PHQ-9 Score 5 6 8 11 11   Difficult doing work/chores Somewhat difficult Not difficult at all Somewhat difficult Somewhat difficult        06/08/2024    8:47 AM 03/01/2024    8:15 AM 02/08/2024    3:00 PM 11/02/2023    8:07 AM  GAD 7 : Generalized Anxiety Score  Nervous, Anxious, on Edge 1 1 1  0  Control/stop worrying 0 0 1 0  Worry too much - different things 1 0 1 0  Trouble relaxing 0 1 1 0  Restless 0 0 1 0  Easily annoyed or irritable 1 1 2 1   Afraid - awful might happen 0 0 0 0  Total GAD 7 Score 3 3 7 1   Anxiety Difficulty Somewhat difficult Not difficult at all Somewhat difficult Not difficult at all       Assessment & Plan:  Assessment & Plan   Adult ADHD (attention deficit hyperactivity disorder) Assessment & Plan: Using Concerta  for ADHD. Insurance change increased costs. Considering brand name due to insurance preference. - Prescribe Concerta  brand to test insurance coverage. - Explore alternative extended-release ADHD medications if Concerta  remains costly.    BMI 40.0-44.9, adult [facility]) Assessment & Plan: On Wegovy  for weight management. Insurance requires prior authorization. Open to tirzepatide if insurance prefers given underlying OSA. - Submit prior authorization for Wegovy . - Investigate insurance coverage for tirzepatide. - Ensure next Wegovy  prescription is for 1.7 mg dose.  Orders: -     Wegovy ; Inject 1.7 mg into the skin once a week.  Dispense: 9 mL; Refill: 1  Insomnia, unspecified type Assessment & Plan: Difficulty sleeping due to stress. Current hydroxyzine  use requires additional support. - Prescribe hydroxyzine  25 mg, option to split for 12.5 mg dose.  Orders: -     hydrOXYzine  HCl; Take 1 tablet (25 mg total) by mouth 2 (two) times daily as needed for anxiety.  Dispense: 180 tablet; Refill: 3     Follow up plan: Return in about 6 weeks (around 07/20/2024).  Hadassah SHAUNNA Nett, MD

## 2024-06-16 ENCOUNTER — Other Ambulatory Visit: Payer: Self-pay | Admitting: Pediatrics

## 2024-06-16 DIAGNOSIS — F909 Attention-deficit hyperactivity disorder, unspecified type: Secondary | ICD-10-CM

## 2024-06-16 MED ORDER — WEGOVY 1.7 MG/0.75ML ~~LOC~~ SOAJ: 1.7000 mg | SUBCUTANEOUS | 1 refills | Status: DC

## 2024-06-16 MED ORDER — METHYLPHENIDATE HCL ER (OSM) 27 MG PO TBCR: 27.0000 mg | EXTENDED_RELEASE_TABLET | ORAL | 0 refills | Status: DC

## 2024-06-16 NOTE — Progress Notes (Signed)
 Sent concerta  refill again.  Hadassah SHAUNNA Nett, MD

## 2024-06-19 ENCOUNTER — Encounter: Payer: Self-pay | Admitting: Pediatrics

## 2024-06-19 ENCOUNTER — Telehealth: Payer: Self-pay

## 2024-06-19 ENCOUNTER — Other Ambulatory Visit (HOSPITAL_COMMUNITY): Payer: Self-pay

## 2024-06-19 NOTE — Telephone Encounter (Signed)
 Pharmacy Patient Advocate Encounter   Received notification from Onbase that prior authorization for Wegovy  1.7 mg/0.75 ml auto injector is required/requested.   Insurance verification completed.   The patient is insured through [facility] .   Per test claim: Product/service not covered. Weight loss drugs are excluded.

## 2024-06-19 NOTE — Assessment & Plan Note (Signed)
 Using Concerta  for ADHD. Insurance change increased costs. Considering brand name due to insurance preference. - Prescribe Concerta  brand to test insurance coverage. - Explore alternative extended-release ADHD medications if Concerta  remains costly.

## 2024-06-19 NOTE — Assessment & Plan Note (Addendum)
 On Wegovy  for weight management. Insurance requires prior authorization. Open to tirzepatide if insurance prefers given underlying OSA. - Submit prior authorization for Wegovy . - Investigate insurance coverage for tirzepatide. - Ensure next Wegovy  prescription is for 1.7 mg dose.

## 2024-06-19 NOTE — Assessment & Plan Note (Signed)
 Difficulty sleeping due to stress. Current hydroxyzine  use requires additional support. - Prescribe hydroxyzine  25 mg, option to split for 12.5 mg dose.

## 2024-06-20 ENCOUNTER — Other Ambulatory Visit (HOSPITAL_COMMUNITY): Payer: Self-pay

## 2024-06-20 NOTE — Telephone Encounter (Signed)
 Unfortunately, per test claim, medication is not covered for weight loss diagnosis. Prescription may be covered under CV risk reduction if patient has evidence of any of the following: Myocardial infarction (MI), (2) Prior ischemic or hemorrhagic stroke, (3) Symptomatic peripheral arterial disease (PAD) as evidence by one of the following (a) Intermittent claudication with ankle-brachial index (ABI) less than 0.85 (at rest); (b) Peripheral arterial revascularization procedure; (c) Amputation due to atherosclerotic disease, thank you.

## 2024-06-21 NOTE — Telephone Encounter (Signed)
 Noted

## 2024-06-26 ENCOUNTER — Other Ambulatory Visit (HOSPITAL_COMMUNITY): Payer: Self-pay

## 2024-06-28 NOTE — Telephone Encounter (Signed)
 Per previous notes, looks like [place] was working on approval/appeal, [place] team has no updates, thanks

## 2024-07-03 ENCOUNTER — Other Ambulatory Visit (HOSPITAL_COMMUNITY): Payer: Self-pay

## 2024-07-11 ENCOUNTER — Other Ambulatory Visit: Payer: Self-pay | Admitting: Pediatrics

## 2024-07-11 DIAGNOSIS — K219 Gastro-esophageal reflux disease without esophagitis: Secondary | ICD-10-CM

## 2024-07-13 NOTE — Telephone Encounter (Signed)
 Requested Prescriptions  Pending Prescriptions Disp Refills   omeprazole  (PRILOSEC) 40 MG capsule [Pharmacy Med Name: OMEPRAZOLE  DR 40 MG CAP] 180 capsule 3    Sig: TAKE 1 CAPSULE BY MOUTH TWICE DAILY BEFORE A MEAL     Gastroenterology: Proton Pump Inhibitors Passed - 07/13/2024  1:22 PM      Passed - Valid encounter within last 12 months    Recent Outpatient Visits           1 month ago Adult ADHD (attention deficit hyperactivity disorder)   [place] [facility] Herold Hadassah SQUIBB, MD   3 months ago Encounter for IUD removal and reinsertion   [place] [facility] Herold Hadassah SQUIBB, MD   4 months ago Menorrhagia with regular cycle   [facility] Herold Hadassah SQUIBB, MD   5 months ago BMI 40.0-44.9, adult [facility])   [facility] Herold Hadassah SQUIBB, MD

## 2024-07-14 ENCOUNTER — Ambulatory Visit: Admitting: Pediatrics

## 2024-08-04 ENCOUNTER — Encounter: Payer: Self-pay | Admitting: Pediatrics

## 2024-08-04 ENCOUNTER — Ambulatory Visit: Admitting: Pediatrics

## 2024-08-04 VITALS — BP 122/77 | HR 76 | Temp 98.0°F | Ht 68.8 in | Wt 285.4 lb

## 2024-08-04 DIAGNOSIS — F330 Major depressive disorder, recurrent, mild: Secondary | ICD-10-CM | POA: Diagnosis not present

## 2024-08-04 DIAGNOSIS — F909 Attention-deficit hyperactivity disorder, unspecified type: Secondary | ICD-10-CM | POA: Diagnosis not present

## 2024-08-04 DIAGNOSIS — Z6841 Body Mass Index (BMI) 40.0 and over, adult: Secondary | ICD-10-CM | POA: Diagnosis not present

## 2024-08-04 DIAGNOSIS — G4700 Insomnia, unspecified: Secondary | ICD-10-CM | POA: Diagnosis not present

## 2024-08-04 DIAGNOSIS — Z23 Encounter for immunization: Secondary | ICD-10-CM

## 2024-08-04 NOTE — Assessment & Plan Note (Signed)
 Not on medication, less agitation reported. Stressors may contribute to symptoms. Prefers to wait before restarting medication. - Hold off on ADHD medication for now. - Consider restarting Concerta  if symptoms worsen.

## 2024-08-04 NOTE — Patient Instructions (Addendum)
 GLP1 options: Wegovy  self pay $499/mo through [employer]) - image below, shipped to you or picked up locally Self pay zepbound (tirzepatide) for them it's $329/mo 1st month then $499/mo thereafter - shipped to you Saxenda  generic (liraglutide ) -$200-$300 There's compounding pharmacy programs like [facility], [facility], [employer] [facility] where you meet with a provider and they can prescribe compounded semaglutide  or tirzepatide that's shipped to you. I have a couple patients who use that service and they pay - attached image from their website.     Compounded company:

## 2024-08-04 NOTE — Progress Notes (Signed)
 Office Visit  BP 122/77   Pulse 76   Temp 98 F (36.7 C) (Oral)   Ht 5' 8.8 (1.748 m)   Wt 285 lb 6.4 oz (129.5 kg)   SpO2 97%   BMI 42.39 kg/m    Subjective:    Patient ID: Rachel Cooley, female    DOB: [birth year], 43 y.o.   MRN: [id number]  HPI: Rachel Cooley is a 43 y.o. female  Chief Complaint  Patient presents with   ADHD   Obesity    Discussed the use of AI scribe software for clinical note transcription with the patient, who gave verbal consent to proceed.  History of Present Illness   Rachel Cooley is a 43 year old female who presents for a follow-up on her mental health status and medication management.  She is currently not taking any medication for ADHD and has been experiencing emotional fluctuations, which she attributes to hormonal changes. Despite these challenges, she notes a decrease in agitation. She is in the process of transitioning to a new living arrangement in the next two to three weeks and continues to cohabitate on weekends while working on co-parenting.  She continues to take Lexapro  for depression and attends therapy sessions every Thursday. Her mood has been 'kind of down' this week, but she maintains a routine of going to the gym three to four days a week and walking on days she cannot go to the gym. She is concerned about her daughters' potential reactions to the upcoming changes in their living situation.  She is taking hydroxyzine  50 mg for sleep issues, which she increased when she started having trouble sleeping. It helps her fall asleep, although there are some nights when she still struggles. She has previously tried Wellbutrin  for mood management but did not find it effective. She has also taken Effexor  and Wellbutrin  in the past before starting ADHD treatment and Lexapro .  She is experiencing challenges with insurance coverage for GLP-1 medications, specifically Wegovy  and Zepbound, due to requirements for a sleep study. She  has signed a release for the sleep study but has not received further updates. She is exploring alternative options for GLP-1 medications due to insurance denials.     Relevant past medical, surgical, family and social history reviewed and updated as indicated. Interim medical history since our last visit reviewed. Allergies and medications reviewed and updated.  ROS per HPI unless specifically indicated above     Objective:    BP 122/77   Pulse 76   Temp 98 F (36.7 C) (Oral)   Ht 5' 8.8 (1.748 m)   Wt 285 lb 6.4 oz (129.5 kg)   SpO2 97%   BMI 42.39 kg/m   Wt Readings from Last 3 Encounters:  08/04/24 285 lb 6.4 oz (129.5 kg)  06/08/24 279 lb (126.6 kg)  03/01/24 294 lb 6.4 oz (133.5 kg)     Physical Exam Constitutional:      Appearance: Normal appearance.  Pulmonary:     Effort: Pulmonary effort is normal.  Musculoskeletal:        General: Normal range of motion.  Skin:    Comments: Normal skin color  Neurological:     General: No focal deficit present.     Mental Status: She is alert. Mental status is at baseline.  Psychiatric:        Mood and Affect: Mood normal.        Behavior: Behavior normal.  Thought Content: Thought content normal.         08/04/2024    8:27 AM 06/08/2024    8:47 AM 03/01/2024    8:15 AM 02/08/2024    3:00 PM 11/02/2023    8:07 AM  Depression screen PHQ 2/9  Decreased Interest 1 1 1 1 2   Down, Depressed, Hopeless 1 1 1 1 2   PHQ - 2 Score 2 2 2 2 4   Altered sleeping 1 1 2 2 1   Tired, decreased energy 1 1 1 1 2   Change in appetite 1 0 0 1 1  Feeling bad or failure about yourself  1 1 0 1 1  Trouble concentrating 1 0 1 1 1   Moving slowly or fidgety/restless 0 0 0 0 0  Suicidal thoughts 0 0 0 0 1  PHQ-9 Score 7 5 6 8 11   Difficult doing work/chores Somewhat difficult Somewhat difficult Not difficult at all Somewhat difficult Somewhat difficult       08/04/2024    8:28 AM 06/08/2024    8:47 AM 03/01/2024    8:15 AM 02/08/2024     3:00 PM  GAD 7 : Generalized Anxiety Score  Nervous, Anxious, on Edge 1 1 1 1   Control/stop worrying 1 0 0 1  Worry too much - different things 1 1 0 1  Trouble relaxing 1 0 1 1  Restless 0 0 0 1  Easily annoyed or irritable 1 1 1 2   Afraid - awful might happen 0 0 0 0  Total GAD 7 Score 5 3 3 7   Anxiety Difficulty Somewhat difficult Somewhat difficult Not difficult at all Somewhat difficult       Assessment & Plan:  Assessment & Plan   Mild episode of recurrent major depressive disorder Assessment & Plan: Symptoms exacerbated by stressors. Lexapro  and hydroxyzine  effective. Discussed adjunct options, hesitant about Wellbutrin  and others due to weight gain concerns. Emphasized therapy's role. - Continue Lexapro . - Continue hydroxyzine  25-50 mg for sleep. - Consider Wellbutrin  if symptoms worsen. - Discussed Vraylar, Abilify, and Rexulti, but prefers to wait. - Continue therapy sessions every Thursday.   BMI 40.0-44.9, adult [facility]) Assessment & Plan: Insurance denied Wegovy , Zepbound pending. Discussed alternatives including self-pay and compounded medications. Interested in generic Saxenda  if not covered. - Follow up with insurance regarding Zepbound approval. - Consider self-pay programs for GLP-1 medications if insurance denies coverage. - Explore compounded medication options like [facility], [employer], and hers. - Consider generic Saxenda  as an alternative.   Adult ADHD (attention deficit hyperactivity disorder) Assessment & Plan: Not on medication, less agitation reported. Stressors may contribute to symptoms. Prefers to wait before restarting medication. - Hold off on ADHD medication for now. - Consider restarting Concerta  if symptoms worsen.   Insomnia, unspecified type Assessment & Plan: Intermittent insomnia managed with hydroxyzine . Reassured about safety at current dose. - Continue hydroxyzine  50 mg for insomnia.       Need for influenza  vaccination -     Flu vaccine trivalent PF, 6mos and older(Flulaval,Afluria,Fluarix,Fluzone)     Follow up plan: Return in about 4 weeks (around 09/01/2024) for Mood.  Hadassah SHAUNNA Nett, MD

## 2024-08-04 NOTE — Assessment & Plan Note (Signed)
 Insurance denied Wegovy , Zepbound pending. Discussed alternatives including self-pay and compounded medications. Interested in generic Saxenda  if not covered. - Follow up with insurance regarding Zepbound approval. - Consider self-pay programs for GLP-1 medications if insurance denies coverage. - Explore compounded medication options like [facility], [employer], and hers. - Consider generic Saxenda  as an alternative.

## 2024-08-04 NOTE — Assessment & Plan Note (Signed)
 Intermittent insomnia managed with hydroxyzine . Reassured about safety at current dose. - Continue hydroxyzine  50 mg for insomnia.

## 2024-08-04 NOTE — Assessment & Plan Note (Signed)
 Symptoms exacerbated by stressors. Lexapro  and hydroxyzine  effective. Discussed adjunct options, hesitant about Wellbutrin  and others due to weight gain concerns. Emphasized therapy's role. - Continue Lexapro . - Continue hydroxyzine  25-50 mg for sleep. - Consider Wellbutrin  if symptoms worsen. - Discussed Vraylar, Abilify, and Rexulti, but prefers to wait. - Continue therapy sessions every Thursday.

## 2024-08-09 ENCOUNTER — Telehealth: Payer: Self-pay | Admitting: Pediatrics

## 2024-08-10 NOTE — Telephone Encounter (Signed)
 Discussed with patient. She is aware her insurance only covers for sleep apnea if her AHI is 15 or greater and her sleep results indicate her is 8.1 only. She is aware of the alternatvies listed on her AVS from 08/04/2024 and will reach back out if needed.

## 2024-08-10 NOTE — Telephone Encounter (Signed)
 Patient aware no current approval with insurance due to OSA not being severe enough.

## 2024-08-17 ENCOUNTER — Other Ambulatory Visit: Payer: Self-pay | Admitting: Pediatrics

## 2024-08-17 DIAGNOSIS — L819 Disorder of pigmentation, unspecified: Secondary | ICD-10-CM

## 2024-08-17 MED ORDER — HYDROQUINONE 4 % EX CREA: TOPICAL_CREAM | CUTANEOUS | 0 refills | Status: AC

## 2024-08-17 NOTE — Progress Notes (Signed)
 Hydroquinone cream requested for hyperpigmentation.   Rachel SHAUNNA Nett, MD

## 2024-08-22 ENCOUNTER — Telehealth: Payer: Self-pay

## 2024-08-22 ENCOUNTER — Other Ambulatory Visit (HOSPITAL_COMMUNITY): Payer: Self-pay

## 2024-08-22 NOTE — Telephone Encounter (Signed)
 Pharmacy Patient Advocate Encounter   Received notification from Onbase that prior authorization for Hydroquinone 4% cream is required/requested.   Insurance verification completed.   The patient is insured through [facility].   Per test claim: Per test claim, medication is not covered due to plan/benefit exclusion, PA not submitted at this time    *see below for good rx prices

## 2024-08-30 ENCOUNTER — Ambulatory Visit: Admitting: Pediatrics

## 2024-08-30 ENCOUNTER — Encounter: Payer: Self-pay | Admitting: Pediatrics

## 2024-08-30 VITALS — BP 110/75 | HR 70 | Temp 97.8°F | Ht 68.82 in | Wt 279.6 lb

## 2024-08-30 DIAGNOSIS — Z6841 Body Mass Index (BMI) 40.0 and over, adult: Secondary | ICD-10-CM

## 2024-08-30 DIAGNOSIS — F330 Major depressive disorder, recurrent, mild: Secondary | ICD-10-CM | POA: Diagnosis not present

## 2024-08-30 DIAGNOSIS — F909 Attention-deficit hyperactivity disorder, unspecified type: Secondary | ICD-10-CM

## 2024-08-30 DIAGNOSIS — L819 Disorder of pigmentation, unspecified: Secondary | ICD-10-CM | POA: Diagnosis not present

## 2024-08-30 NOTE — Progress Notes (Signed)
 Office Visit  BP 110/75 (BP Location: Left Arm, Patient Position: Sitting)   Pulse 70   Temp 97.8 F (36.6 C) (Oral)   Ht 5' 8.82 (1.748 m)   Wt 279 lb 9.6 oz (126.8 kg)   LMP 08/25/2024 (Approximate)   SpO2 99%   BMI 41.51 kg/m    Subjective:    Patient ID: Rachel Cooley, female    DOB: 05-Oct-1981, 43 y.o.   MRN: [id number]  HPI: Rachel Cooley is a 43 y.o. female  Chief Complaint  Patient presents with   Anxiety   Depression    Discussed the use of AI scribe software for clinical note transcription with the patient, who gave verbal consent to proceed.  History of Present Illness   Rachel Cooley is a 43 year old female who presents for follow-up on Wegovy  treatment and mental health management.  She is currently on Wegovy  for weight management and has completed her second injection of 0.25 mg. She tolerates the medication well with no nausea but experiences some constipation. The medication is received via mail after an online consultation.  She is on Lexapro  for mental health management and feels 'okay' with the current regimen. She maintains her routine of gym workouts, walking, and therapy. She mentions occasional sleep disturbances related to situational stress but not daily.  She uses hydroxyzine  as needed for anxiety and finds it effective, occasionally doubling the dose when necessary.  She is using a bleaching cream for skin pigmentation issues, which she feels is effective after a week of use. Previous laser treatments were ineffective.  She is actively engaged in self-care activities, including therapy and facials.      Relevant past medical, surgical, family and social history reviewed and updated as indicated. Interim medical history since our last visit reviewed. Allergies and medications reviewed and updated.  ROS per HPI unless specifically indicated above     Objective:    BP 110/75 (BP Location: Left Arm, Patient Position: Sitting)    Pulse 70   Temp 97.8 F (36.6 C) (Oral)   Ht 5' 8.82 (1.748 m)   Wt 279 lb 9.6 oz (126.8 kg)   LMP 08/25/2024 (Approximate)   SpO2 99%   BMI 41.51 kg/m   Wt Readings from Last 3 Encounters:  08/30/24 279 lb 9.6 oz (126.8 kg)  08/04/24 285 lb 6.4 oz (129.5 kg)  06/08/24 279 lb (126.6 kg)     Physical Exam Constitutional:      Appearance: Normal appearance.  Pulmonary:     Effort: Pulmonary effort is normal.  Musculoskeletal:        General: Normal range of motion.  Skin:    Comments: Normal skin color  Neurological:     General: No focal deficit present.     Mental Status: She is alert. Mental status is at baseline.  Psychiatric:        Mood and Affect: Mood normal.        Behavior: Behavior normal.        Thought Content: Thought content normal.         08/30/2024    8:14 AM 08/04/2024    8:27 AM 06/08/2024    8:47 AM 03/01/2024    8:15 AM 02/08/2024    3:00 PM  Depression screen PHQ 2/9  Decreased Interest 1 1 1 1 1   Down, Depressed, Hopeless 1 1 1 1 1   PHQ - 2 Score 2 2 2 2 2   Altered sleeping 1  1 1 2 2   Tired, decreased energy 1 1 1 1 1   Change in appetite 1 1 0 0 1  Feeling bad or failure about yourself  1 1 1  0 1  Trouble concentrating 0 1 0 1 1  Moving slowly or fidgety/restless 0 0 0 0 0  Suicidal thoughts 0 0 0 0 0  PHQ-9 Score 6 7  5  6  8    Difficult doing work/chores Somewhat difficult Somewhat difficult Somewhat difficult Not difficult at all Somewhat difficult     Data saved with a previous flowsheet row definition       08/30/2024    8:14 AM 08/04/2024    8:28 AM 06/08/2024    8:47 AM 03/01/2024    8:15 AM  GAD 7 : Generalized Anxiety Score  Nervous, Anxious, on Edge 1 1 1 1   Control/stop worrying 1 1 0 0  Worry too much - different things 1 1 1  0  Trouble relaxing 1 1 0 1  Restless 0 0 0 0  Easily annoyed or irritable 1 1 1 1   Afraid - awful might happen 0 0 0 0  Total GAD 7 Score 5 5 3 3   Anxiety Difficulty Somewhat difficult  Somewhat difficult Somewhat difficult Not difficult at all       Assessment & Plan:  Assessment & Plan   BMI 40.0-44.9, adult [facility]) Assessment & Plan: Doing well on compounded semaglutide . CTM.   Mild episode of recurrent major depressive disorder Assessment & Plan: Exacerbated recently in setting of separation from spouse but overall coping well. Stable on Lexapro , managing stressors with therapy and self-care. - Continue Lexapro  as prescribed. - Continue therapy sessions. - Encouraged self-care activities. - If need to augment treatment consider welbutrin given comorbid ADHD   Adult ADHD (attention deficit hyperactivity disorder) Assessment & Plan: Managing with behavioral medications. Previously on concerta . CTM.   Hyperpigmentation Hydroquinone  cream effective, laser treatments ineffective. - Continue using bleaching cream as directed.    Follow up plan: Return in about 3 months (around 11/30/2024) for wt management.  Hadassah SHAUNNA Nett, MD

## 2024-08-30 NOTE — Assessment & Plan Note (Signed)
 Exacerbated recently in setting of separation from spouse but overall coping well. Stable on Lexapro , managing stressors with therapy and self-care. - Continue Lexapro  as prescribed. - Continue therapy sessions. - Encouraged self-care activities. - If need to augment treatment consider welbutrin given comorbid ADHD

## 2024-08-30 NOTE — Assessment & Plan Note (Signed)
 Managing with behavioral medications. Previously on concerta . CTM.

## 2024-08-30 NOTE — Assessment & Plan Note (Signed)
 Doing well on compounded semaglutide . CTM.

## 2024-11-20 ENCOUNTER — Ambulatory Visit: Admitting: Nurse Practitioner
